# Patient Record
Sex: Male | Born: 1955 | Race: White | Hispanic: No | Marital: Married | Smoking: Never smoker
Health system: Southern US, Community
[De-identification: ages and names within clinical notes are randomized; demographics above are authoritative.]

## PROBLEM LIST (undated history)

## (undated) DIAGNOSIS — E785 Hyperlipidemia, unspecified: Secondary | ICD-10-CM

## (undated) DIAGNOSIS — F419 Anxiety disorder, unspecified: Secondary | ICD-10-CM

## (undated) DIAGNOSIS — R7301 Impaired fasting glucose: Secondary | ICD-10-CM

## (undated) DIAGNOSIS — G4700 Insomnia, unspecified: Secondary | ICD-10-CM

## (undated) DIAGNOSIS — I10 Essential (primary) hypertension: Secondary | ICD-10-CM

## (undated) DIAGNOSIS — L80 Vitiligo: Secondary | ICD-10-CM

## (undated) DIAGNOSIS — K219 Gastro-esophageal reflux disease without esophagitis: Secondary | ICD-10-CM

## (undated) DIAGNOSIS — Z86010 Personal history of colonic polyps: Secondary | ICD-10-CM

## (undated) HISTORY — DX: Impaired fasting glucose: R73.01

## (undated) HISTORY — DX: Personal history of colonic polyps: Z86.010

## (undated) HISTORY — DX: Hyperlipidemia, unspecified: E78.5

## (undated) HISTORY — DX: Anxiety disorder, unspecified: F41.9

## (undated) HISTORY — DX: Gastro-esophageal reflux disease without esophagitis: K21.9

## (undated) HISTORY — DX: Essential (primary) hypertension: I10

## (undated) HISTORY — DX: Vitiligo: L80

## (undated) HISTORY — PX: OTHER SURGICAL HISTORY: SHX169

## (undated) HISTORY — DX: Insomnia, unspecified: G47.00

## (undated) HISTORY — PX: WRIST SURGERY: SHX841

---

## 2001-02-06 ENCOUNTER — Encounter: Payer: Self-pay | Admitting: Otolaryngology

## 2001-02-06 ENCOUNTER — Ambulatory Visit (HOSPITAL_COMMUNITY): Admission: RE | Admit: 2001-02-06 | Discharge: 2001-02-06 | Payer: Self-pay | Admitting: Otolaryngology

## 2002-12-23 ENCOUNTER — Encounter: Payer: Self-pay | Admitting: Emergency Medicine

## 2002-12-23 ENCOUNTER — Emergency Department (HOSPITAL_COMMUNITY): Admission: EM | Admit: 2002-12-23 | Discharge: 2002-12-23 | Payer: Self-pay | Admitting: Emergency Medicine

## 2004-03-09 ENCOUNTER — Ambulatory Visit: Admission: RE | Admit: 2004-03-09 | Discharge: 2004-03-09 | Payer: Self-pay | Admitting: Family Medicine

## 2005-04-18 HISTORY — PX: COLONOSCOPY: SHX174

## 2006-03-20 ENCOUNTER — Ambulatory Visit: Payer: Self-pay | Admitting: Internal Medicine

## 2006-04-03 ENCOUNTER — Ambulatory Visit: Payer: Self-pay | Admitting: Internal Medicine

## 2010-08-21 ENCOUNTER — Emergency Department (HOSPITAL_COMMUNITY)
Admission: EM | Admit: 2010-08-21 | Discharge: 2010-08-21 | Disposition: A | Discharge status: Home / Self Care | Payer: BC Managed Care – PPO | Attending: Emergency Medicine | Admitting: Emergency Medicine

## 2010-08-21 DIAGNOSIS — F411 Generalized anxiety disorder: Secondary | ICD-10-CM | POA: Insufficient documentation

## 2010-08-21 DIAGNOSIS — M436 Torticollis: Secondary | ICD-10-CM | POA: Insufficient documentation

## 2011-02-28 ENCOUNTER — Other Ambulatory Visit: Payer: Self-pay | Admitting: Family Medicine

## 2011-02-28 DIAGNOSIS — R748 Abnormal levels of other serum enzymes: Secondary | ICD-10-CM

## 2011-03-02 ENCOUNTER — Ambulatory Visit (HOSPITAL_COMMUNITY)
Admission: RE | Admit: 2011-03-02 | Discharge: 2011-03-02 | Disposition: A | Discharge status: Home / Self Care | Payer: BC Managed Care – PPO | Source: Ambulatory Visit | Attending: Family Medicine | Admitting: Family Medicine

## 2011-03-02 DIAGNOSIS — K8010 Calculus of gallbladder with chronic cholecystitis without obstruction: Secondary | ICD-10-CM | POA: Insufficient documentation

## 2011-03-02 DIAGNOSIS — R748 Abnormal levels of other serum enzymes: Secondary | ICD-10-CM | POA: Insufficient documentation

## 2012-10-08 ENCOUNTER — Other Ambulatory Visit: Payer: Self-pay | Admitting: Family Medicine

## 2012-11-04 LAB — HM COLONOSCOPY

## 2012-12-20 ENCOUNTER — Other Ambulatory Visit: Payer: Self-pay | Admitting: Family Medicine

## 2012-12-20 NOTE — Telephone Encounter (Signed)
Refill times one, needs f/u OV

## 2013-01-11 ENCOUNTER — Telehealth: Payer: Self-pay | Admitting: Family Medicine

## 2013-01-11 DIAGNOSIS — Z125 Encounter for screening for malignant neoplasm of prostate: Secondary | ICD-10-CM

## 2013-01-11 DIAGNOSIS — Z79899 Other long term (current) drug therapy: Secondary | ICD-10-CM

## 2013-01-11 DIAGNOSIS — E782 Mixed hyperlipidemia: Secondary | ICD-10-CM

## 2013-01-11 NOTE — Telephone Encounter (Signed)
Patient needs BW paperwork °

## 2013-01-13 NOTE — Telephone Encounter (Signed)
Lip liv m7 psa 

## 2013-01-14 NOTE — Telephone Encounter (Signed)
Blood work orders placed in Epic. Patient notified. 

## 2013-01-18 LAB — LIPID PANEL
Cholesterol: 142 mg/dL (ref 0–200)
HDL: 36 mg/dL — ABNORMAL LOW (ref >39)
LDL Cholesterol: 80 mg/dL (ref 0–99)
Total CHOL/HDL Ratio: 3.9 Ratio
Triglycerides: 132 mg/dL (ref <150)
VLDL: 26 mg/dL (ref 0–40)

## 2013-01-18 LAB — BASIC METABOLIC PANEL
BUN: 17 mg/dL (ref 6–23)
Chloride: 104 mEq/L (ref 96–112)
Creat: 1.29 mg/dL (ref 0.50–1.35)
Glucose, Bld: 94 mg/dL (ref 70–99)
Potassium: 4.5 mEq/L (ref 3.5–5.3)
Sodium: 142 mEq/L (ref 135–145)

## 2013-01-18 LAB — HEPATIC FUNCTION PANEL
AST: 34 U/L (ref 0–37)
Alkaline Phosphatase: 89 U/L (ref 39–117)
Bilirubin, Direct: 0.2 mg/dL (ref 0.0–0.3)
Indirect Bilirubin: 0.8 mg/dL (ref 0.0–0.9)
Total Protein: 6.4 g/dL (ref 6.0–8.3)

## 2013-02-16 ENCOUNTER — Encounter: Payer: Self-pay | Admitting: *Deleted

## 2013-02-18 ENCOUNTER — Encounter: Payer: Self-pay | Admitting: Family Medicine

## 2013-02-18 ENCOUNTER — Ambulatory Visit (INDEPENDENT_AMBULATORY_CARE_PROVIDER_SITE_OTHER): Payer: BC Managed Care – PPO | Admitting: Family Medicine

## 2013-02-18 VITALS — BP 128/80 | Ht 70.0 in | Wt 207.2 lb

## 2013-02-18 DIAGNOSIS — Z8042 Family history of malignant neoplasm of prostate: Secondary | ICD-10-CM

## 2013-02-18 DIAGNOSIS — Z0000 Encounter for general adult medical examination without abnormal findings: Secondary | ICD-10-CM

## 2013-02-18 DIAGNOSIS — G4700 Insomnia, unspecified: Secondary | ICD-10-CM

## 2013-02-18 DIAGNOSIS — L80 Vitiligo: Secondary | ICD-10-CM | POA: Insufficient documentation

## 2013-02-18 NOTE — Progress Notes (Signed)
  Subjective:    Patient ID: Cristian Davis, male    DOB: May 29, 1955, 57 y.o.   MRN: [id number]  HPI Patient is here today for annual physical.  He has no problems/concerns.   Exercise not so hot, not working overtime  Has a recumbent bike  Already had flu vaccine.  Colonoscopy not due for a couple more years.  Results for orders placed in visit on 02/16/13  HM COLONOSCOPY      Result Value Range   HM Colonoscopy repeat in 10 years    HM COLONOSCOPY      Result Value Range   HM Colonoscopy repeat in 10 years        Review of Systems  Constitutional: Negative for fever, activity change and appetite change.  HENT: Negative for congestion and rhinorrhea.   Eyes: Negative for discharge.  Respiratory: Negative for cough and wheezing.   Cardiovascular: Negative for chest pain.  Gastrointestinal: Negative for vomiting, abdominal pain and blood in stool.  Genitourinary: Negative for frequency and difficulty urinating.  Musculoskeletal: Negative for neck pain.  Skin: Negative for rash.  Allergic/Immunologic: Negative for environmental allergies and food allergies.  Neurological: Negative for weakness and headaches.  Psychiatric/Behavioral: Negative for agitation.       Objective:   Physical Exam  Vitals reviewed. Constitutional: He appears well-developed and well-nourished.  HENT:  Head: Normocephalic and atraumatic.  Right Ear: External ear normal.  Left Ear: External ear normal.  Nose: Nose normal.  Mouth/Throat: Oropharynx is clear and moist.  Eyes: EOM are normal. Pupils are equal, round, and reactive to light.  Neck: Normal range of motion. Neck supple. No thyromegaly present.  Cardiovascular: Normal rate, regular rhythm and normal heart sounds.   No murmur heard. Pulmonary/Chest: Effort normal and breath sounds normal. No respiratory distress. He has no wheezes.  Abdominal: Soft. Bowel sounds are normal. He exhibits no distension and no mass. There is no  tenderness.  Genitourinary: Penis normal.  Musculoskeletal: Normal range of motion. He exhibits no edema.  Lymphadenopathy:    He has no cervical adenopathy.  Neurological: He is alert. He exhibits normal muscle tone.  Skin: Skin is warm and dry. No erythema.  Psychiatric: He has a normal mood and affect. His behavior is normal. Judgment normal.    Prostate within normal limits.  Skin does reveal multiple areas of vitiligo      Assessment & Plan:  Impression #1 wellness exam. #2 vitiligo. #3 insomnia. #4 family history of prostate cancer. Blood work all reviewed. Plan exercise strongly encourage. Maintain same medications. Check yearly. WSL

## 2013-03-03 IMAGING — US US ABDOMEN LIMITED
1 series · 14 of 25 positions shown · non-contrast
Comparison: None.

CLINICAL DATA: Elevated liver enzymes.

LIMITED ABDOMINAL ULTRASOUND - RIGHT UPPER QUADRANT

[Series 1: us abdomen limited · 0.31mm/px · 14 of 45 slices shown]
[im 1/45]
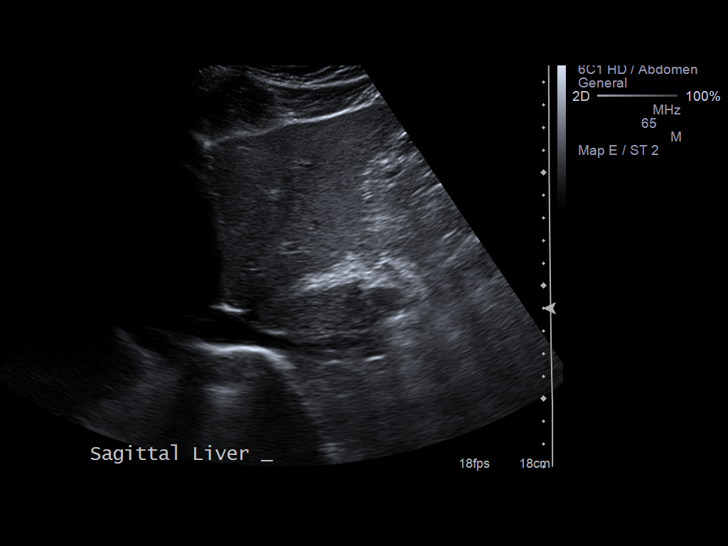
[im 4/45]
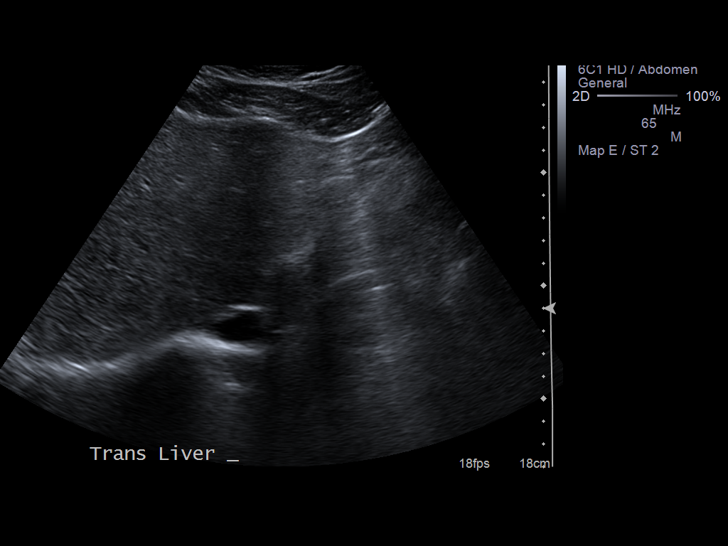
[im 8/45]
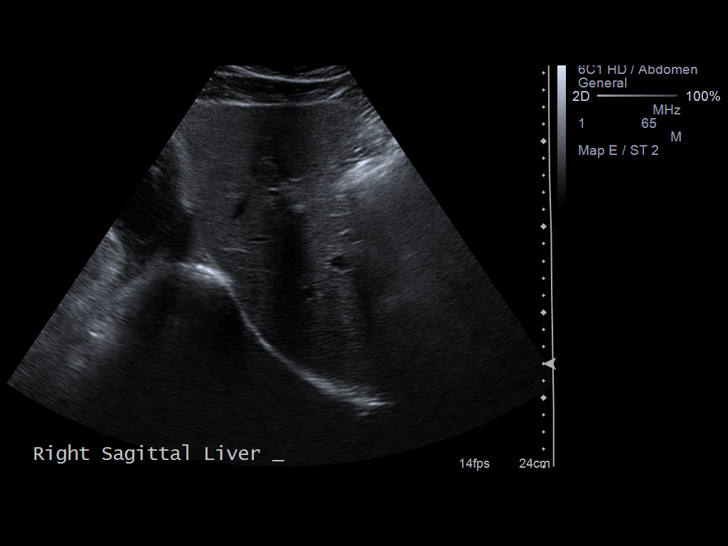
[im 12/45]
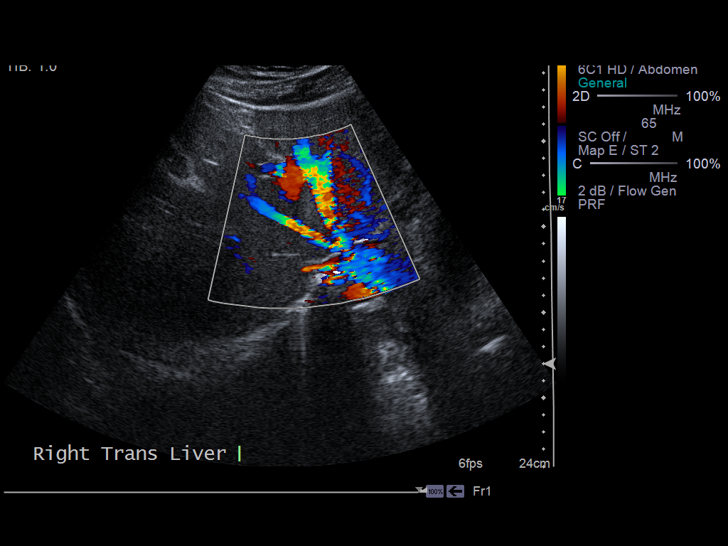
[im 15/45]
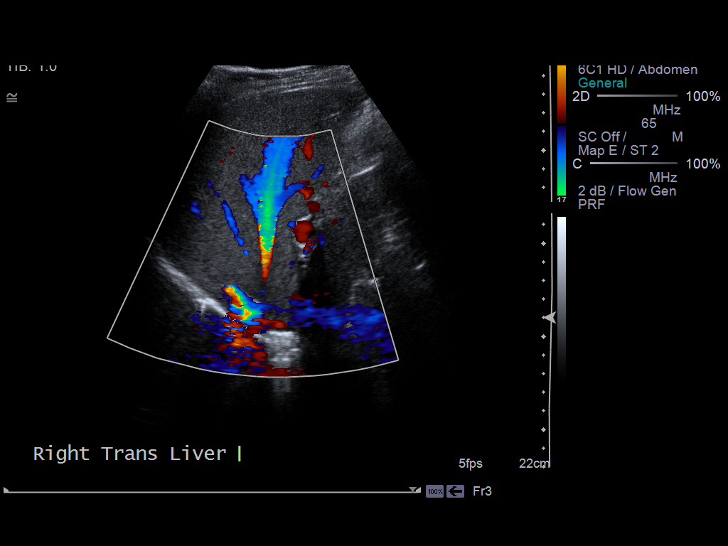
[im 17/45]
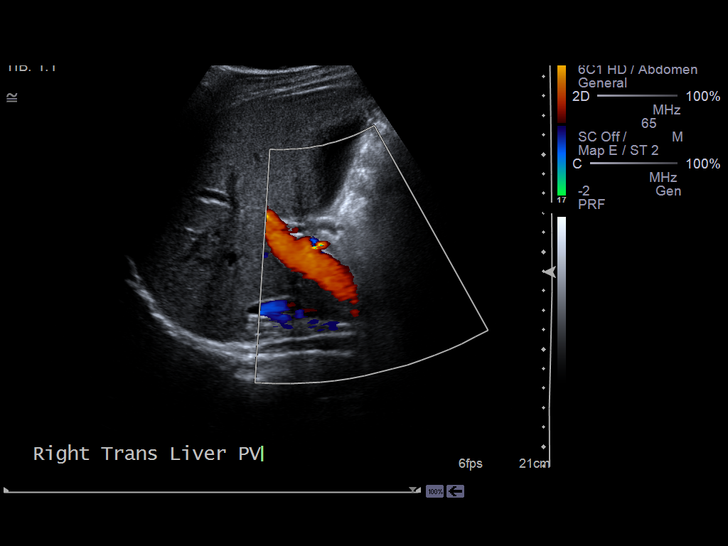
[im 21/45]
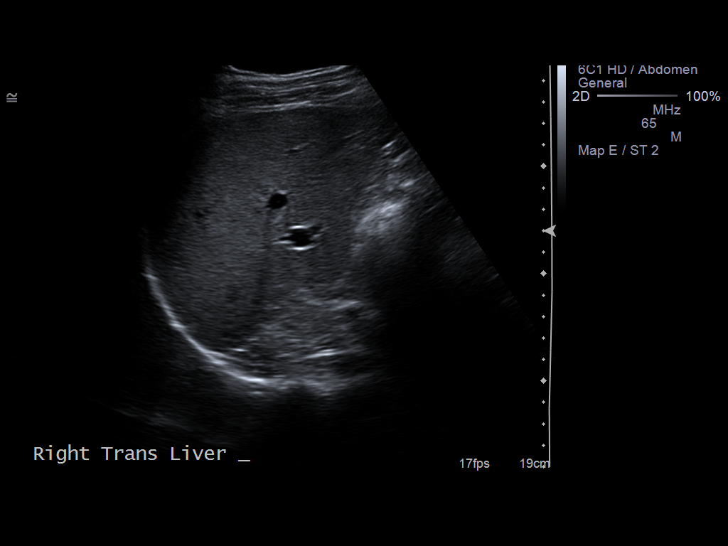
[im 24/45]
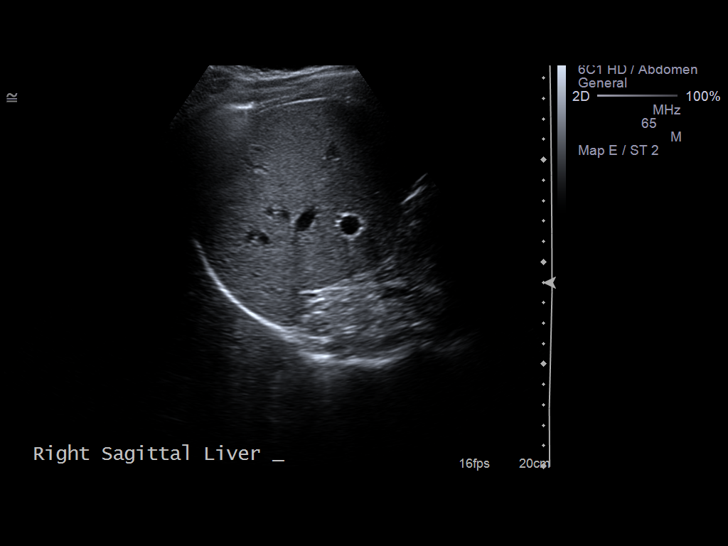
[im 28/45]
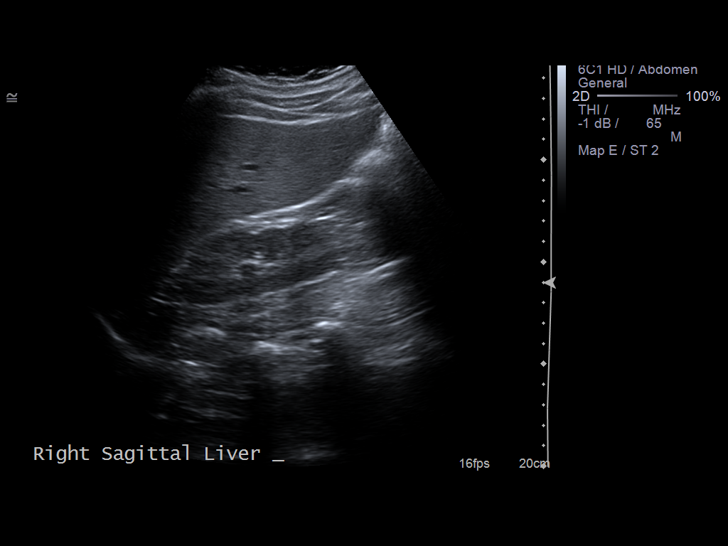
[im 30/45]
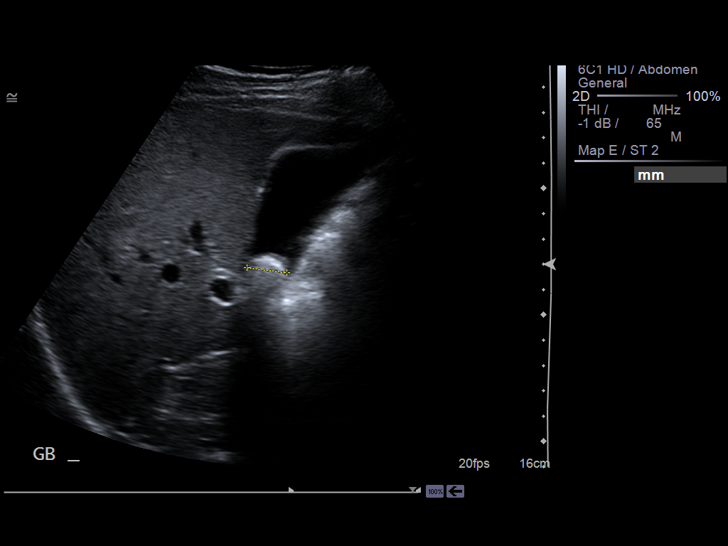
[im 34/45]
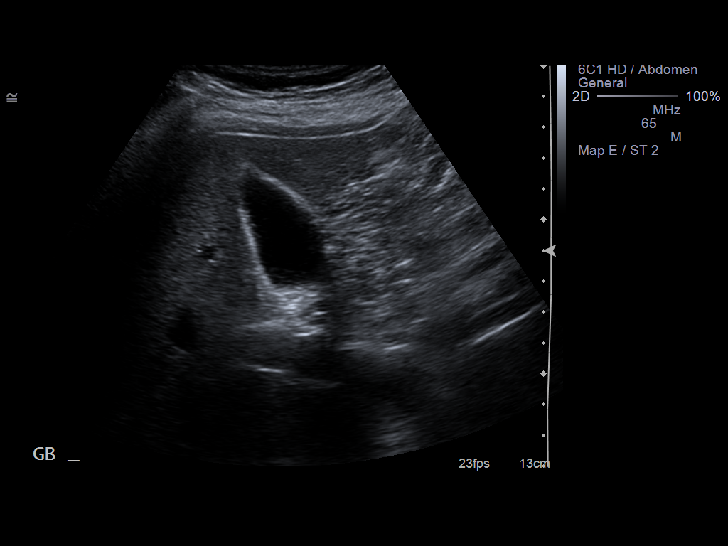
[im 37/45]
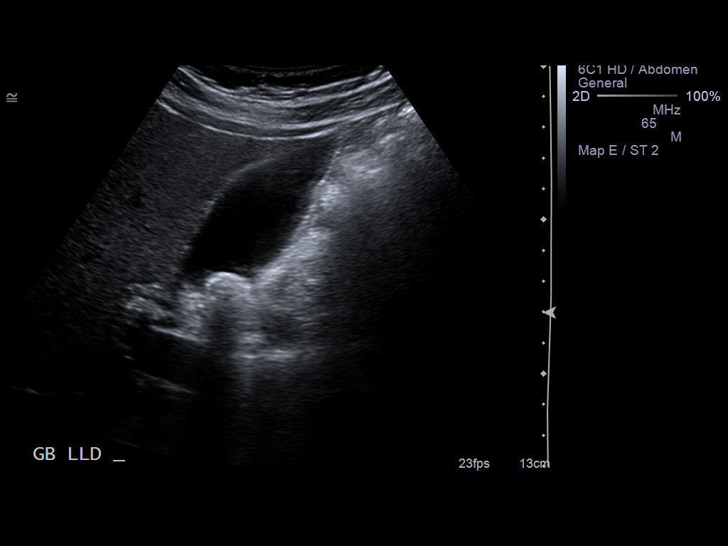
[im 41/45]
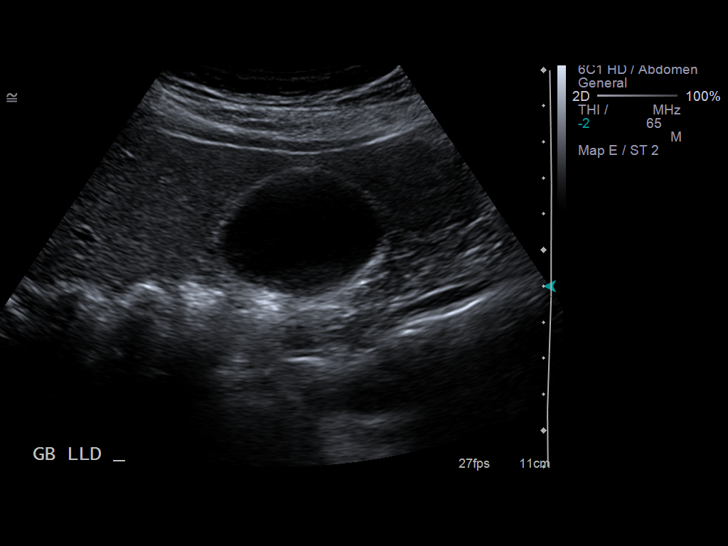
[im 45/45]
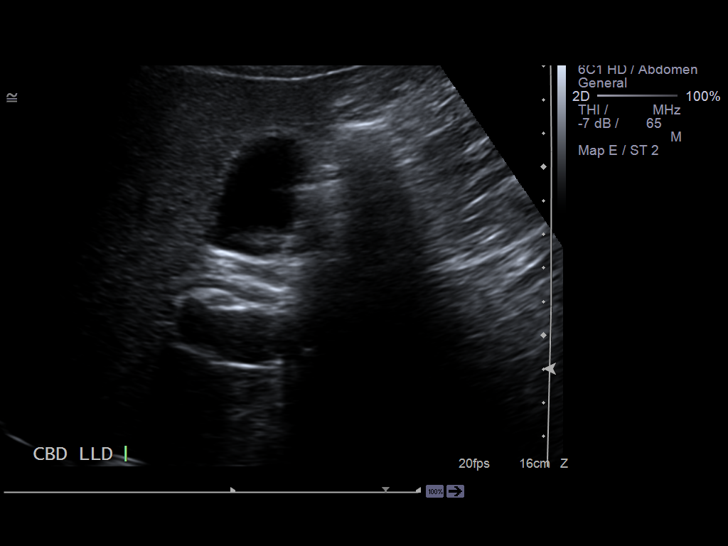

[14 of 25 positions shown; findings below may reference images not displayed]

FINDINGS: Gallbladder:  A 1.8 cm echogenic lesion near the gallbladder neck
shows posterior acoustic shadowing and moves slightly with left
lateral decubitus positioning.  No gallbladder wall thickening or
sonographic Murphy's sign.

Common bile duct:  Measures 4 mm, within normal limits.

Liver:  Uniform parenchymal echo texture.
IMPRESSION: Cholelithiasis without acute cholecystitis.

## 2013-03-15 ENCOUNTER — Other Ambulatory Visit: Payer: Self-pay | Admitting: Family Medicine

## 2013-08-24 ENCOUNTER — Other Ambulatory Visit: Payer: Self-pay | Admitting: Family Medicine

## 2013-11-17 ENCOUNTER — Other Ambulatory Visit: Payer: Self-pay | Admitting: Family Medicine

## 2013-12-17 ENCOUNTER — Other Ambulatory Visit: Payer: Self-pay | Admitting: Family Medicine

## 2014-02-04 ENCOUNTER — Telehealth: Payer: Self-pay | Admitting: Family Medicine

## 2014-02-04 DIAGNOSIS — Z125 Encounter for screening for malignant neoplasm of prostate: Secondary | ICD-10-CM

## 2014-02-04 DIAGNOSIS — Z79899 Other long term (current) drug therapy: Secondary | ICD-10-CM

## 2014-02-04 DIAGNOSIS — E785 Hyperlipidemia, unspecified: Secondary | ICD-10-CM

## 2014-02-04 NOTE — Telephone Encounter (Signed)
Last had Lipid, Liver, Met 7 and PSA 01/2013

## 2014-02-04 NOTE — Telephone Encounter (Signed)
Blood work ordered in Epic. Patient notified. 

## 2014-02-04 NOTE — Telephone Encounter (Signed)
Patient needs order for blood work for upcoming physical.

## 2014-02-04 NOTE — Telephone Encounter (Signed)
Rep same 

## 2014-02-07 LAB — BASIC METABOLIC PANEL
BUN: 20 mg/dL (ref 6–23)
CHLORIDE: 103 meq/L (ref 96–112)
CO2: 28 meq/L (ref 19–32)
CREATININE: 1.21 mg/dL (ref 0.50–1.35)
Calcium: 9.4 mg/dL (ref 8.4–10.5)
GLUCOSE: 95 mg/dL (ref 70–99)
POTASSIUM: 4.3 meq/L (ref 3.5–5.3)
Sodium: 142 mEq/L (ref 135–145)

## 2014-02-07 LAB — HEPATIC FUNCTION PANEL
ALK PHOS: 97 U/L (ref 39–117)
ALT: 32 U/L (ref 0–53)
AST: 31 U/L (ref 0–37)
Albumin: 4.5 g/dL (ref 3.5–5.2)
BILIRUBIN DIRECT: 0.2 mg/dL (ref 0.0–0.3)
BILIRUBIN INDIRECT: 0.6 mg/dL (ref 0.2–1.2)
TOTAL PROTEIN: 6.3 g/dL (ref 6.0–8.3)
Total Bilirubin: 0.8 mg/dL (ref 0.2–1.2)

## 2014-02-07 LAB — LIPID PANEL
Cholesterol: 141 mg/dL (ref 0–200)
HDL: 38 mg/dL — ABNORMAL LOW (ref >39)
LDL CALC: 81 mg/dL (ref 0–99)
Total CHOL/HDL Ratio: 3.7 Ratio
Triglycerides: 110 mg/dL (ref <150)
VLDL: 22 mg/dL (ref 0–40)

## 2014-02-08 LAB — PSA: PSA: 0.46 ng/mL (ref <=4.00)

## 2014-02-24 ENCOUNTER — Ambulatory Visit (INDEPENDENT_AMBULATORY_CARE_PROVIDER_SITE_OTHER): Payer: BC Managed Care – PPO | Admitting: Family Medicine

## 2014-02-24 ENCOUNTER — Encounter: Payer: Self-pay | Admitting: Family Medicine

## 2014-02-24 VITALS — BP 126/80 | Ht 70.0 in | Wt 207.2 lb

## 2014-02-24 DIAGNOSIS — Z0000 Encounter for general adult medical examination without abnormal findings: Secondary | ICD-10-CM

## 2014-02-24 NOTE — Progress Notes (Addendum)
Subjective:    Patient ID: Cristian Davis, male    DOB: 03-27-1956, 58 y.o.   MRN: [id number]  HPI  The patient comes in today for a wellness visit.  exerc ise staying active, partial credit for that.  Has recumbent   A review of their health history was completed.  A review of medications was also completed.  Any needed refills; no  Eating habits: good  Falls/  MVA accidents in past few months: none  Regular exercise: not much  Specialist pt sees on regular basis: no  Preventative health issues were discussed.   Additional concerns: none  Results for orders placed or performed in visit on 02/04/14  Lipid panel  Result Value Ref Range   Cholesterol 141 0 - 200 mg/dL   Triglycerides 110 <150 mg/dL   HDL 38 (L) >39 mg/dL   Total CHOL/HDL Ratio 3.7 Ratio   VLDL 22 0 - 40 mg/dL   LDL Cholesterol 81 0 - 99 mg/dL  Hepatic function panel  Result Value Ref Range   Total Bilirubin 0.8 0.2 - 1.2 mg/dL   Bilirubin, Direct 0.2 0.0 - 0.3 mg/dL   Indirect Bilirubin 0.6 0.2 - 1.2 mg/dL   Alkaline Phosphatase 97 39 - 117 U/L   AST 31 0 - 37 U/L   ALT 32 0 - 53 U/L   Total Protein 6.3 6.0 - 8.3 g/dL   Albumin 4.5 3.5 - 5.2 g/dL  Basic metabolic panel  Result Value Ref Range   Sodium 142 135 - 145 mEq/L   Potassium 4.3 3.5 - 5.3 mEq/L   Chloride 103 96 - 112 mEq/L   CO2 28 19 - 32 mEq/L   Glucose, Bld 95 70 - 99 mg/dL   BUN 20 6 - 23 mg/dL   Creat 1.21 0.50 - 1.35 mg/dL   Calcium 9.4 8.4 - 10.5 mg/dL  PSA  Result Value Ref Range   PSA 0.46 <=4.00 ng/mL   Flu shot given at work amitryp still helping and working out well  Vitiligo about the same  Has noticed incr gi  Some tend for bowels to stretch out  No pain of  r tend or  Cramping  Night no abd pain  Reflux heartburn minimal, rec cutting down caffeine,and cut down chocolate    Review of Systems  Constitutional: Negative for fever, activity change and appetite change.  HENT: Negative for congestion  and rhinorrhea.   Eyes: Negative for discharge.  Respiratory: Negative for cough and wheezing.   Cardiovascular: Negative for chest pain.  Gastrointestinal: Negative for vomiting, abdominal pain and blood in stool.  Genitourinary: Negative for frequency and difficulty urinating.  Musculoskeletal: Negative for neck pain.  Skin: Negative for rash.  Allergic/Immunologic: Negative for environmental allergies and food allergies.  Neurological: Negative for weakness and headaches.  Psychiatric/Behavioral: Negative for agitation.  All other systems reviewed and are negative.      Objective:   Physical Exam  Constitutional: He appears well-developed and well-nourished.  HENT:  Head: Normocephalic and atraumatic.  Right Ear: External ear normal.  Left Ear: External ear normal.  Nose: Nose normal.  Mouth/Throat: Oropharynx is clear and moist.  Eyes: EOM are normal. Pupils are equal, round, and reactive to light.  Neck: Normal range of motion. Neck supple. No thyromegaly present.  Cardiovascular: Normal rate, regular rhythm and normal heart sounds.   No murmur heard. Pulmonary/Chest: Effort normal and breath sounds normal. No respiratory distress. He has no wheezes.  Abdominal: Soft.  Bowel sounds are normal. He exhibits no distension and no mass. There is no tenderness.  Genitourinary: Prostate normal and penis normal.  Musculoskeletal: Normal range of motion. He exhibits no edema.  Lymphadenopathy:    He has no cervical adenopathy.  Neurological: He is alert. He exhibits normal muscle tone.  Skin: Skin is warm and dry. No erythema.  Psychiatric: He has a normal mood and affect. His behavior is normal. Judgment normal.  Vitals reviewed.  Skin multiple discrete patches of vitiligo.       Assessment & Plan:  Impression 1 wellness exam up-to-date on colonoscopy to and 3 more years.#2 vitiligo #3 family history prostate cancer number for migraine headaches stable plan maintain same  medication. Diet exercise discussed in encourage. Check yearly. Use sunscreen. WSL

## 2014-03-21 ENCOUNTER — Other Ambulatory Visit: Payer: Self-pay | Admitting: Family Medicine

## 2014-03-24 ENCOUNTER — Telehealth: Payer: Self-pay | Admitting: Family Medicine

## 2014-03-24 MED ORDER — AMITRIPTYLINE HCL 75 MG PO TABS: ORAL_TABLET | ORAL | Status: DC

## 2014-03-24 NOTE — Telephone Encounter (Signed)
Patient was refilled his amitriptyline (ELAVIL) 75 MG tablet with a one month supply.  He wanted to know if we can give this to him on a 90 day schedule again?  In the notes it says "needs OV", but he was just in on 02/24/14 for a wellness exam and the patient assumed that this would count for this medicine.

## 2014-03-24 NOTE — Telephone Encounter (Signed)
Med sent, pt notified.

## 2014-09-27 ENCOUNTER — Other Ambulatory Visit: Payer: Self-pay | Admitting: Family Medicine

## 2014-09-29 NOTE — Telephone Encounter (Signed)
Ok 6 mo worth 

## 2015-02-23 ENCOUNTER — Telehealth: Payer: Self-pay | Admitting: Family Medicine

## 2015-02-23 DIAGNOSIS — Z13220 Encounter for screening for lipoid disorders: Secondary | ICD-10-CM

## 2015-02-23 DIAGNOSIS — Z125 Encounter for screening for malignant neoplasm of prostate: Secondary | ICD-10-CM

## 2015-02-23 DIAGNOSIS — Z79899 Other long term (current) drug therapy: Secondary | ICD-10-CM

## 2015-02-23 NOTE — Telephone Encounter (Signed)
Pt is wanting lab orders to be sent over for his wellness visit. Last lab per epic were: lipid,hepatic,bmp,and psa on 02/07/14

## 2015-02-23 NOTE — Telephone Encounter (Signed)
Rep same as one yr ago

## 2015-02-24 NOTE — Telephone Encounter (Signed)
Notified patient bloodwork has been ordered.  

## 2015-03-07 LAB — LIPID PANEL
CHOL/HDL RATIO: 4.1 ratio (ref 0.0–5.0)
Cholesterol, Total: 156 mg/dL (ref 100–199)
HDL: 38 mg/dL — AB (ref >39)
LDL CALC: 91 mg/dL (ref 0–99)
TRIGLYCERIDES: 135 mg/dL (ref 0–149)
VLDL Cholesterol Cal: 27 mg/dL (ref 5–40)

## 2015-03-07 LAB — HEPATIC FUNCTION PANEL
ALT: 40 IU/L (ref 0–44)
AST: 42 IU/L — ABNORMAL HIGH (ref 0–40)
Albumin: 4.5 g/dL (ref 3.5–5.5)
Alkaline Phosphatase: 98 IU/L (ref 39–117)
Bilirubin Total: 0.9 mg/dL (ref 0.0–1.2)
Bilirubin, Direct: 0.22 mg/dL (ref 0.00–0.40)
Total Protein: 6.5 g/dL (ref 6.0–8.5)

## 2015-03-07 LAB — PSA: Prostate Specific Ag, Serum: 0.4 ng/mL (ref 0.0–4.0)

## 2015-03-07 LAB — BASIC METABOLIC PANEL
BUN / CREAT RATIO: 14 (ref 9–20)
BUN: 16 mg/dL (ref 6–24)
CALCIUM: 9.1 mg/dL (ref 8.7–10.2)
CHLORIDE: 101 mmol/L (ref 97–106)
CO2: 24 mmol/L (ref 18–29)
Creatinine, Ser: 1.17 mg/dL (ref 0.76–1.27)
GFR calc Af Amer: 79 mL/min/{1.73_m2} (ref >59)
GFR calc non Af Amer: 68 mL/min/{1.73_m2} (ref >59)
GLUCOSE: 96 mg/dL (ref 65–99)
Potassium: 4.4 mmol/L (ref 3.5–5.2)
Sodium: 141 mmol/L (ref 136–144)

## 2015-03-26 ENCOUNTER — Other Ambulatory Visit: Payer: Self-pay | Admitting: Family Medicine

## 2015-03-30 ENCOUNTER — Encounter: Payer: Self-pay | Admitting: Family Medicine

## 2015-03-30 ENCOUNTER — Ambulatory Visit (INDEPENDENT_AMBULATORY_CARE_PROVIDER_SITE_OTHER): Payer: BLUE CROSS/BLUE SHIELD | Admitting: Family Medicine

## 2015-03-30 VITALS — BP 128/86 | Ht 70.0 in | Wt 210.6 lb

## 2015-03-30 DIAGNOSIS — Z0000 Encounter for general adult medical examination without abnormal findings: Secondary | ICD-10-CM

## 2015-03-30 DIAGNOSIS — G4700 Insomnia, unspecified: Secondary | ICD-10-CM

## 2015-03-30 DIAGNOSIS — R413 Other amnesia: Secondary | ICD-10-CM

## 2015-03-30 DIAGNOSIS — Z23 Encounter for immunization: Secondary | ICD-10-CM

## 2015-03-30 NOTE — Progress Notes (Signed)
Subjective:    Patient ID: Cristian Davis, male    DOB: [birth year], 59 y.o.   MRN: [id number]  HPI The patient comes in today for a wellness visit.  Results for orders placed or performed in visit on 02/23/15  Lipid panel  Result Value Ref Range   Cholesterol, Total 156 100 - 199 mg/dL   Triglycerides 135 0 - 149 mg/dL   HDL 38 (L) >39 mg/dL   VLDL Cholesterol Cal 27 5 - 40 mg/dL   LDL Calculated 91 0 - 99 mg/dL   Chol/HDL Ratio 4.1 0.0 - 5.0 ratio units  Hepatic function panel  Result Value Ref Range   Total Protein 6.5 6.0 - 8.5 g/dL   Albumin 4.5 3.5 - 5.5 g/dL   Bilirubin Total 0.9 0.0 - 1.2 mg/dL   Bilirubin, Direct 0.22 0.00 - 0.40 mg/dL   Alkaline Phosphatase 98 39 - 117 IU/L   AST 42 (H) 0 - 40 IU/L   ALT 40 0 - 44 IU/L  Basic metabolic panel  Result Value Ref Range   Glucose 96 65 - 99 mg/dL   BUN 16 6 - 24 mg/dL   Creatinine, Ser 1.17 0.76 - 1.27 mg/dL   GFR calc non Af Amer 68 >59 mL/min/1.73   GFR calc Af Amer 79 >59 mL/min/1.73   BUN/Creatinine Ratio 14 9 - 20   Sodium 141 136 - 144 mmol/L   Potassium 4.4 3.5 - 5.2 mmol/L   Chloride 101 97 - 106 mmol/L   CO2 24 18 - 29 mmol/L   Calcium 9.1 8.7 - 10.2 mg/dL  PSA  Result Value Ref Range   Prostate Specific Ag, Serum 0.4 0.0 - 4.0 ng/mL    fifty colonoscopy  Pos fam hx f prost cancer  A review of their health history was completed.  A review of medications was also completed.  Any needed refills; yes  Eating habits: trying to  Falls/  MVA accidents in past few months: no  Regular exercise: 2-3 times a week   Specialist pt sees on regular basis: no  Preventative health issues were discussed.   Additional concerns: memory  Ex bike couple times per wk,  Legs are getting storonger, still notes sob, rides bike for cardiopulm purposes   Memory nto as good as used to, not doing well in this regard. Last yr or so, forgeting simple things, might forget a little, if not concerntrating  forgets. fam hx of dementia, all sisters. Stress levbel not that high at this time, getting good sleep , o major dangeroud, modly vonyrny.  States amitriptyline definitely helps for sleep. Also helps for migraine headaches. No obvious side effects. No morning drowsiness. Review of Systems  Constitutional: Negative for fever, activity change and appetite change.  HENT: Negative for congestion and rhinorrhea.   Eyes: Negative for discharge.  Respiratory: Negative for cough and wheezing.   Cardiovascular: Negative for chest pain.  Gastrointestinal: Negative for vomiting, abdominal pain and blood in stool.  Genitourinary: Negative for frequency and difficulty urinating.  Musculoskeletal: Negative for neck pain.  Skin: Negative for rash.  Allergic/Immunologic: Negative for environmental allergies and food allergies.  Neurological: Negative for weakness and headaches.  Psychiatric/Behavioral: Negative for agitation.  All other systems reviewed and are negative.      Objective:   Physical Exam  Constitutional: He appears well-developed and well-nourished.  HENT:  Head: Normocephalic and atraumatic.  Right Ear: External ear normal.  Left Ear: External ear normal.  Nose:  Nose normal.  Mouth/Throat: Oropharynx is clear and moist.  Eyes: EOM are normal. Pupils are equal, round, and reactive to light.  Neck: Normal range of motion. Neck supple. No thyromegaly present.  Cardiovascular: Normal rate, regular rhythm and normal heart sounds.   No murmur heard. Pulmonary/Chest: Effort normal and breath sounds normal. No respiratory distress. He has no wheezes.  Abdominal: Soft. Bowel sounds are normal. He exhibits no distension and no mass. There is no tenderness.  Genitourinary: Penis normal.  Musculoskeletal: Normal range of motion. He exhibits no edema.  Lymphadenopathy:    He has no cervical adenopathy.  Neurological: He is alert. He exhibits normal muscle tone.  Skin: Skin is warm and  dry. No erythema.  Psychiatric: He has a normal mood and affect. His behavior is normal. Judgment normal.  Vitals reviewed.         Assessment & Plan:  Impression 1 wellness exam #2 chronic headaches and insomnia control well with amitriptyline. Meds reviewed. Patient maintain same #3 short-term memory issues. Many questions asked patient completely oriented. Judgment intact. Just short-term memory loss not a sign of coming dementia and discussed at length plan flu shot today. Colonoscopy next year. Blood work reviewed. Amitriptyline reviewed diet exercise discussed WSL

## 2015-06-22 ENCOUNTER — Other Ambulatory Visit: Payer: Self-pay | Admitting: Family Medicine

## 2016-02-10 ENCOUNTER — Telehealth: Payer: Self-pay | Admitting: Family Medicine

## 2016-02-10 DIAGNOSIS — Z125 Encounter for screening for malignant neoplasm of prostate: Secondary | ICD-10-CM

## 2016-02-10 DIAGNOSIS — Z13220 Encounter for screening for lipoid disorders: Secondary | ICD-10-CM

## 2016-02-10 DIAGNOSIS — Z79899 Other long term (current) drug therapy: Secondary | ICD-10-CM

## 2016-02-10 NOTE — Telephone Encounter (Signed)
Patient requesting order for blood work for upcoming appointment in December.

## 2016-02-10 NOTE — Telephone Encounter (Signed)
Patient last labs 11/16- Lipid, Liver, Met 7 and PSA

## 2016-02-10 NOTE — Telephone Encounter (Signed)
Blood work ordered in EPIC. Patient notified. 

## 2016-02-10 NOTE — Telephone Encounter (Signed)
Rep same 

## 2016-02-28 LAB — BASIC METABOLIC PANEL
BUN/Creatinine Ratio: 12 (ref 9–20)
BUN: 15 mg/dL (ref 6–24)
CALCIUM: 9.5 mg/dL (ref 8.7–10.2)
CHLORIDE: 100 mmol/L (ref 96–106)
CO2: 26 mmol/L (ref 18–29)
Creatinine, Ser: 1.27 mg/dL (ref 0.76–1.27)
GFR calc Af Amer: 71 mL/min/{1.73_m2} (ref >59)
GFR calc non Af Amer: 61 mL/min/{1.73_m2} (ref >59)
GLUCOSE: 114 mg/dL — AB (ref 65–99)
Potassium: 4.4 mmol/L (ref 3.5–5.2)
Sodium: 145 mmol/L — ABNORMAL HIGH (ref 134–144)

## 2016-02-28 LAB — LIPID PANEL
CHOL/HDL RATIO: 4.1 ratio (ref 0.0–5.0)
Cholesterol, Total: 161 mg/dL (ref 100–199)
HDL: 39 mg/dL — AB (ref >39)
LDL CALC: 93 mg/dL (ref 0–99)
TRIGLYCERIDES: 146 mg/dL (ref 0–149)
VLDL Cholesterol Cal: 29 mg/dL (ref 5–40)

## 2016-02-28 LAB — HEPATIC FUNCTION PANEL
ALT: 35 IU/L (ref 0–44)
AST: 33 IU/L (ref 0–40)
Albumin: 4.6 g/dL (ref 3.5–5.5)
Alkaline Phosphatase: 106 IU/L (ref 39–117)
BILIRUBIN TOTAL: 1.1 mg/dL (ref 0.0–1.2)
Bilirubin, Direct: 0.24 mg/dL (ref 0.00–0.40)
TOTAL PROTEIN: 6.6 g/dL (ref 6.0–8.5)

## 2016-02-28 LAB — PSA: PROSTATE SPECIFIC AG, SERUM: 0.6 ng/mL (ref 0.0–4.0)

## 2016-03-29 ENCOUNTER — Encounter: Payer: Self-pay | Admitting: Internal Medicine

## 2016-04-04 ENCOUNTER — Ambulatory Visit (INDEPENDENT_AMBULATORY_CARE_PROVIDER_SITE_OTHER): Payer: BLUE CROSS/BLUE SHIELD | Admitting: Family Medicine

## 2016-04-04 ENCOUNTER — Encounter: Payer: Self-pay | Admitting: Family Medicine

## 2016-04-04 ENCOUNTER — Encounter: Payer: Self-pay | Admitting: Internal Medicine

## 2016-04-04 VITALS — BP 132/80 | Ht 69.5 in | Wt 213.0 lb

## 2016-04-04 DIAGNOSIS — G8929 Other chronic pain: Secondary | ICD-10-CM

## 2016-04-04 DIAGNOSIS — R070 Pain in throat: Secondary | ICD-10-CM

## 2016-04-04 DIAGNOSIS — F5101 Primary insomnia: Secondary | ICD-10-CM | POA: Diagnosis not present

## 2016-04-04 DIAGNOSIS — Z0000 Encounter for general adult medical examination without abnormal findings: Secondary | ICD-10-CM

## 2016-04-04 MED ORDER — AMITRIPTYLINE HCL 25 MG PO TABS: ORAL_TABLET | ORAL | 0 refills | Status: DC

## 2016-04-04 NOTE — Patient Instructions (Signed)
Try nexium ea morn for a month, if does not help try zyrtec 10 at night for a mont Prediabetes Prediabetes is the condition of having a blood sugar (blood glucose) level that is higher than it should be, but not high enough for you to be diagnosed with type 2 diabetes. Having prediabetes puts you at risk for developing type 2 diabetes (type 2 diabetes mellitus). Prediabetes may be called impaired glucose tolerance or impaired fasting glucose. Prediabetes usually does not cause symptoms. Your health care provider can diagnose this condition with blood tests. You may be tested for prediabetes if you are overweight and if you have at least one other risk factor for prediabetes. Risk factors for prediabetes include:  Having a family member with type 2 diabetes.  Being overweight or obese.  Being older than age 19.  Being of American-Indian, African-American, Hispanic/Latino, or Asian/Pacific Islander descent.  Having an inactive (sedentary) lifestyle.  Having a history of gestational diabetes or polycystic ovarian syndrome (PCOS).  Having low levels of good cholesterol (HDL-C) or high levels of blood fats (triglycerides).  Having high blood pressure. What is blood glucose and how is blood glucose measured?   Blood glucose refers to the amount of glucose in your bloodstream. Glucose comes from eating foods that contain sugars and starches (carbohydrates) that the body breaks down into glucose. Your blood glucose level may be measured in mg/dL (milligrams per deciliter) or mmol/L (millimoles per liter).Your blood glucose may be checked with one or more of the following blood tests:  A fasting blood glucose (FBG) test. You will not be allowed to eat (you will fast) for at least 8 hours before a blood sample is taken.  A normal range for FBG is 70-100 mg/dl (3.9-5.6 mmol/L).  An A1c (hemoglobin A1c) blood test. This test provides information about blood glucose control over the previous  2?105months.  An oral glucose tolerance test (OGTT). This test measures your blood glucose twice:  After fasting. This is your baseline level.  Two hours after you drink a beverage that contains glucose. You may be diagnosed with prediabetes:  If your FBG is 100?125 mg/dL (5.6-6.9 mmol/L).  If your A1c level is 5.7?6.4%.  If your OGGT result is 140?199 mg/dL (7.8-11 mmol/L). These blood tests may be repeated to confirm your diagnosis. What happens if blood glucose is too high? The pancreas produces a hormone (insulin) that helps move glucose from the bloodstream into cells. When cells in the body do not respond properly to insulin that the body makes (insulin resistance), excess glucose builds up in the blood instead of going into cells. As a result, high blood glucose (hyperglycemia) can develop, which can cause many complications. This is a symptom of prediabetes. What can happen if blood glucose stays higher than normal for a long time? Having high blood glucose for a long time is dangerous. Too much glucose in your blood can damage your nerves and blood vessels. Long-term damage can lead to complications from diabetes, which may include:  Heart disease.  Stroke.  Blindness.  Kidney disease.  Depression.  Poor circulation in the feet and legs, which could lead to surgical removal (amputation) in severe cases. How can prediabetes be prevented from turning into type 2 diabetes?   To help prevent type 2 diabetes, take the following actions:  Be physically active.  Do moderate-intensity physical activity for at least 30 minutes on at least 5 days of the week, or as much as told by your health care  provider. This could be brisk walking, biking, or water aerobics.  Ask your health care provider what activities are safe for you. A mix of physical activities may be best, such as walking, swimming, cycling, and strength training.  Lose weight as told by your health care  provider.  Losing 5-7% of your body weight can reverse insulin resistance.  Your health care provider can determine how much weight loss is best for you and can help you lose weight safely.  Follow a healthy meal plan. This includes eating lean proteins, complex carbohydrates, fresh fruits and vegetables, low-fat dairy products, and healthy fats.  Follow instructions from your health care provider about eating or drinking restrictions.  Make an appointment to see a diet and nutrition specialist (registered dietitian) to help you create a healthy eating plan that is right for you.  Do not smoke or use any tobacco products, such as cigarettes, chewing tobacco, and e-cigarettes. If you need help quitting, ask your health care provider.  Take over-the-counter and prescription medicines as told by your health care provider. You may be prescribed medicines that help lower the risk of type 2 diabetes. This information is not intended to replace advice given to you by your health care provider. Make sure you discuss any questions you have with your health care provider. Document Released: 07/27/2015 Document Revised: 09/10/2015 Document Reviewed: 05/26/2015 Elsevier Interactive Patient Education  2017 [employer].

## 2016-04-04 NOTE — Progress Notes (Signed)
Subjective:    Patient ID: Cristian Davis, male    DOB: 01/05/1956, 60 y.o.   MRN: [id number]  HPI The patient comes in today for a wellness visit.   Results for orders placed or performed in visit on 02/10/16  Lipid panel  Result Value Ref Range   Cholesterol, Total 161 100 - 199 mg/dL   Triglycerides 146 0 - 149 mg/dL   HDL 39 (L) >39 mg/dL   VLDL Cholesterol Cal 29 5 - 40 mg/dL   LDL Calculated 93 0 - 99 mg/dL   Chol/HDL Ratio 4.1 0.0 - 5.0 ratio units  Hepatic function panel  Result Value Ref Range   Total Protein 6.6 6.0 - 8.5 g/dL   Albumin 4.6 3.5 - 5.5 g/dL   Bilirubin Total 1.1 0.0 - 1.2 mg/dL   Bilirubin, Direct 0.24 0.00 - 0.40 mg/dL   Alkaline Phosphatase 106 39 - 117 IU/L   AST 33 0 - 40 IU/L   ALT 35 0 - 44 IU/L  Basic metabolic panel  Result Value Ref Range   Glucose 114 (H) 65 - 99 mg/dL   BUN 15 6 - 24 mg/dL   Creatinine, Ser 1.27 0.76 - 1.27 mg/dL   GFR calc non Af Amer 61 >59 mL/min/1.73   GFR calc Af Amer 71 >59 mL/min/1.73   BUN/Creatinine Ratio 12 9 - 20   Sodium 145 (H) 134 - 144 mmol/L   Potassium 4.4 3.5 - 5.2 mmol/L   Chloride 100 96 - 106 mmol/L   CO2 26 18 - 29 mmol/L   Calcium 9.5 8.7 - 10.2 mg/dL  PSA  Result Value Ref Range   Prostate Specific Ag, Serum 0.6 0.0 - 4.0 ng/mL   A review of their health history was completed.  A review of medications was also completed.  Neg colonoscopyhad flu shot alredy   Taking the amitryp faithfully, helped knock down ththe anity   Takes reg   Took the wrong ned accidentally  Few weks ago   Due for next colonoscop none in the past  Curious about weaning of the amitryp now     Any needed refills; none  Eating habits: health conscious  Falls/  MVA accidents in past few months: none  Regular exercise: none  Specialist pt sees on regular basis: none  Preventative health issues were discussed.   Additional concerns: none  Mo had adult onset diabetes  Diet drinks  occas choc  cookie  Not a big [place] person  Not exercising much these days  Patient notes chronic irritation in the throat. Even went to see an ENT doctor. They recommended a trial of Nexium. Patient does notice some postnasal drip like clearing of throat at times. Also notes some reflux type symptoms at times  Review of Systems  Constitutional: Negative for activity change, appetite change and fever.  HENT: Negative for congestion and rhinorrhea.   Eyes: Negative for discharge.  Respiratory: Negative for cough and wheezing.   Cardiovascular: Negative for chest pain.  Gastrointestinal: Negative for abdominal pain, blood in stool and vomiting.  Genitourinary: Negative for difficulty urinating and frequency.  Musculoskeletal: Negative for neck pain.  Skin: Negative for rash.  Allergic/Immunologic: Negative for environmental allergies and food allergies.  Neurological: Negative for weakness and headaches.  Psychiatric/Behavioral: Negative for agitation.  All other systems reviewed and are negative.      Objective:   Physical Exam  Constitutional: He appears well-developed and well-nourished.  HENT:  Head: Normocephalic and  atraumatic.  Right Ear: External ear normal.  Left Ear: External ear normal.  Nose: Nose normal.  Mouth/Throat: Oropharynx is clear and moist.  Eyes: EOM are normal. Pupils are equal, round, and reactive to light.  Neck: Normal range of motion. Neck supple. No thyromegaly present.  Cardiovascular: Normal rate, regular rhythm and normal heart sounds.   No murmur heard. Pulmonary/Chest: Effort normal and breath sounds normal. No respiratory distress. He has no wheezes.  Abdominal: Soft. Bowel sounds are normal. He exhibits no distension and no mass. There is no tenderness.  Genitourinary: Penis normal.  Musculoskeletal: Normal range of motion. He exhibits no edema.  Lymphadenopathy:    He has no cervical adenopathy.  Neurological: He is alert. He exhibits normal muscle  tone.  Skin: Skin is warm and dry. No erythema.  Psychiatric: He has a normal mood and affect. His behavior is normal. Judgment normal.  Vitals reviewed.         Assessment & Plan:  Impression 1 wellness exam #2 amitriptyline patient uses primarily now for insomnia but originally use for headaches. Long discussion held. Patient would like to try to get off this medication wean recommended patient will give a trial #3 chronic throat symptoms. Approach discussed. Try month Nexium. Than a month of Zyrtec. Rationale discussed plan blood work reviewed. Medications refilled diet exercise discussed up-to-date on colonoscopy given shingles vaccine

## 2016-05-23 ENCOUNTER — Encounter: Payer: Self-pay | Admitting: Internal Medicine

## 2016-06-06 ENCOUNTER — Encounter: Payer: Self-pay | Admitting: Internal Medicine

## 2016-06-16 ENCOUNTER — Encounter: Payer: Self-pay | Admitting: Internal Medicine

## 2016-06-21 ENCOUNTER — Encounter: Payer: Self-pay | Admitting: Family Medicine

## 2016-06-21 ENCOUNTER — Ambulatory Visit (INDEPENDENT_AMBULATORY_CARE_PROVIDER_SITE_OTHER): Payer: 59 | Admitting: Family Medicine

## 2016-06-21 VITALS — BP 140/86 | Ht 69.5 in | Wt 216.0 lb

## 2016-06-21 DIAGNOSIS — F5101 Primary insomnia: Secondary | ICD-10-CM

## 2016-06-21 MED ORDER — AMITRIPTYLINE HCL 75 MG PO TABS: 75.0000 mg | ORAL_TABLET | Freq: Every day | ORAL | 3 refills | Status: DC

## 2016-06-21 NOTE — Progress Notes (Signed)
   Subjective:    Patient ID: Cristian Davis, male    DOB: July 12, 1955, 61 y.o.   MRN: [id number]  HPI Patient arrives to discuss stopping amitriptyline. Patient states he tried to wean off with the plan from last visit but he got all out of sorts and has went back to original dosing.  Went down to the fifties, noted some h a withthem  Not leeping as well  Then knocked down to 25, incr level of ha   Then stopped, felt bad off the med  Stomach symt and head hurting  ythen they suggest Review of Systems    No headache, no major weight loss or weight gain, no chest pain no back pain abdominal pain no change in bowel habits complete ROS otherwise negative  Objective:   Physical Exam Alert vitals stable, NAD. Blood pressure good on repeat. HEENT normal. Lungs clear. Heart regular rate and rhythm.        Assessment & Plan:  Impression 1 chronic amitriptyline usage for panic attacks. Patient attempted wheezing. Developed headaches. Also developed worsening insomnia. Long discussion held. This time patient would prefer to maintain 75 daily at bedtime. If down the road he would like to wean we'll try again with 10 mg tablets on a very slow wean

## 2016-07-11 ENCOUNTER — Ambulatory Visit (AMBULATORY_SURGERY_CENTER): Payer: Self-pay

## 2016-07-11 VITALS — Ht 70.0 in | Wt 213.8 lb

## 2016-07-11 DIAGNOSIS — Z1211 Encounter for screening for malignant neoplasm of colon: Secondary | ICD-10-CM

## 2016-07-11 NOTE — Progress Notes (Signed)
Denies allergies to eggs or soy products. Denies complication of anesthesia or sedation. Denies use of weight loss medication. Denies use of O2.   Emmi instructions declined.  

## 2016-07-12 ENCOUNTER — Encounter: Payer: Self-pay | Admitting: Internal Medicine

## 2016-07-25 ENCOUNTER — Ambulatory Visit (AMBULATORY_SURGERY_CENTER): Payer: 59 | Admitting: Internal Medicine

## 2016-07-25 ENCOUNTER — Encounter: Payer: Self-pay | Admitting: Internal Medicine

## 2016-07-25 VITALS — BP 122/68 | HR 61 | Temp 97.1°F | Resp 11 | Ht 70.0 in | Wt 213.0 lb

## 2016-07-25 DIAGNOSIS — Z1211 Encounter for screening for malignant neoplasm of colon: Secondary | ICD-10-CM | POA: Diagnosis present

## 2016-07-25 DIAGNOSIS — Z86010 Personal history of colonic polyps: Secondary | ICD-10-CM | POA: Diagnosis not present

## 2016-07-25 DIAGNOSIS — D123 Benign neoplasm of transverse colon: Secondary | ICD-10-CM

## 2016-07-25 DIAGNOSIS — Z1212 Encounter for screening for malignant neoplasm of rectum: Secondary | ICD-10-CM | POA: Diagnosis not present

## 2016-07-25 HISTORY — PX: COLONOSCOPY W/ POLYPECTOMY: SHX1380

## 2016-07-25 MED ORDER — SODIUM CHLORIDE 0.9 % IV SOLN
500.0000 mL | INTRAVENOUS | Status: DC
Start: 2016-07-25 — End: 2017-03-31

## 2016-07-25 NOTE — Progress Notes (Signed)
Report given to PACU, vss 

## 2016-07-25 NOTE — Op Note (Signed)
[place] Patient Name: Cristian Davis Procedure Date: 07/25/2016 10:28 AM MRN: [id number] Endoscopist: Gatha Mayer , MD Age: 61 Referring MD:  Date of Birth: 01-04-1956 Gender: Male Account #: [id number] Procedure:                Colonoscopy Indications:              Screening for colorectal malignant neoplasm, Last                            colonoscopy: 2007 Medicines:                Propofol per Anesthesia, Monitored Anesthesia Care Procedure:                Pre-Anesthesia Assessment:                           - Prior to the procedure, a History and Physical                            was performed, and patient medications and                            allergies were reviewed. The patient's tolerance of                            previous anesthesia was also reviewed. The risks                            and benefits of the procedure and the sedation                            options and risks were discussed with the patient.                            All questions were answered, and informed consent                            was obtained. Prior Anticoagulants: The patient has                            taken no previous anticoagulant or antiplatelet                            agents. ASA Grade Assessment: I - A normal, healthy                            patient. After reviewing the risks and benefits,                            the patient was deemed in satisfactory condition to                            undergo the procedure.  After obtaining informed consent, the colonoscope                            was passed under direct vision. Throughout the                            procedure, the patient's blood pressure, pulse, and                            oxygen saturations were monitored continuously. The                            Colonoscope was introduced through the anus and                            advanced to the the cecum,  identified by                            appendiceal orifice and ileocecal valve. The                            colonoscopy was performed without difficulty. The                            patient tolerated the procedure well. The quality                            of the bowel preparation was excellent. The bowel                            preparation used was SUPREP. The ileocecal valve,                            appendiceal orifice, and rectum were photographed. Scope In: 10:40:49 AM Scope Out: 11:01:11 AM Scope Withdrawal Time: 0 hours 15 minutes 51 seconds  Total Procedure Duration: 0 hours 20 minutes 22 seconds  Findings:                 The perianal and digital rectal examinations were                            normal.                           Two sessile polyps were found in the transverse                            colon. The polyps were diminutive in size. These                            polyps were removed with a cold biopsy forceps.                            Resection and retrieval were complete. Verification  of patient identification for the specimen was                            done. Estimated blood loss was minimal.                           A diminutive polyp was found in the transverse                            colon. The polyp was sessile. The polyp was removed                            with a cold snare. Resection and retrieval were                            complete. Verification of patient identification                            for the specimen was done. Estimated blood loss was                            minimal.                           The exam was otherwise without abnormality on                            direct and retroflexion views. Complications:            No immediate complications. Estimated Blood Loss:     Estimated blood loss was minimal. Impression:               - Two diminutive polyps in the transverse colon,                             removed with a cold biopsy forceps. Resected and                            retrieved.                           - One diminutive polyp in the transverse colon,                            removed with a cold snare. Resected and retrieved.                           - The examination was otherwise normal on direct                            and retroflexion views. Recommendation:           - Patient has a contact number available for                            emergencies. The  signs and symptoms of potential                            delayed complications were discussed with the                            patient. Return to normal activities tomorrow.                            Written discharge instructions were provided to the                            patient.                           - Resume previous diet.                           - Continue present medications.                           - Repeat colonoscopy is recommended. The                            colonoscopy date will be determined after pathology                            results from today's exam become available for                            review. Gatha Mayer, MD 07/25/2016 11:06:21 AM This report has been signed electronically.

## 2016-07-25 NOTE — Progress Notes (Signed)
Called to room to assist during endoscopic procedure.  Patient ID and intended procedure confirmed with present staff. Received instructions for my participation in the procedure from the performing physician.  

## 2016-07-25 NOTE — Patient Instructions (Addendum)
   I found and removed 3 tiny polyps that look benign. I will let you know pathology results and when to have another routine colonoscopy by mail and/or My Chart.  I appreciate the opportunity to care for you. Gatha Mayer, MD, [facility]   Information on polyps given to you today  YOU HAD AN ENDOSCOPIC PROCEDURE TODAY AT [facility]:   Refer to the procedure report that was given to you for any specific questions about what was found during the examination.  If the procedure report does not answer your questions, please call your gastroenterologist to clarify.  If you requested that your care partner not be given the details of your procedure findings, then the procedure report has been included in a sealed envelope for you to review at your convenience later.  YOU SHOULD EXPECT: Some feelings of bloating in the abdomen. Passage of more gas than usual.  Walking can help get rid of the air that was put into your GI tract during the procedure and reduce the bloating. If you had a lower endoscopy (such as a colonoscopy or flexible sigmoidoscopy) you may notice spotting of blood in your stool or on the toilet paper. If you underwent a bowel prep for your procedure, you may not have a normal bowel movement for a few days.  Please Note:  You might notice some irritation and congestion in your nose or some drainage.  This is from the oxygen used during your procedure.  There is no need for concern and it should clear up in a day or so.  SYMPTOMS TO REPORT IMMEDIATELY:   Following lower endoscopy (colonoscopy or flexible sigmoidoscopy):  Excessive amounts of blood in the stool  Significant tenderness or worsening of abdominal pains  Swelling of the abdomen that is new, acute  Fever of 100F or higher    For urgent or emergent issues, a gastroenterologist can be reached at any hour by calling [phone].   DIET:  We do recommend a small meal at first, but then you may  proceed to your regular diet.  Drink plenty of fluids but you should avoid alcoholic beverages for 24 hours.  ACTIVITY:  You should plan to take it easy for the rest of today and you should NOT DRIVE or use heavy machinery until tomorrow (because of the sedation medicines used during the test).    FOLLOW UP: Our staff will call the number listed on your records the next business day following your procedure to check on you and address any questions or concerns that you may have regarding the information given to you following your procedure. If we do not reach you, we will leave a message.  However, if you are feeling well and you are not experiencing any problems, there is no need to return our call.  We will assume that you have returned to your regular daily activities without incident.  If any biopsies were taken you will be contacted by phone or by letter within the next 1-3 weeks.  Please call us at [phone] if you have not heard about the biopsies in 3 weeks.    SIGNATURES/CONFIDENTIALITY: You and/or your care partner have signed paperwork which will be entered into your electronic medical record.  These signatures attest to the fact that that the information above on your After Visit Summary has been reviewed and is understood.  Full responsibility of the confidentiality of this discharge information lies with you and/or your care-partner.

## 2016-07-25 NOTE — Progress Notes (Signed)
Pt's states no medical or surgical changes since previsit or office visit. 

## 2016-07-26 ENCOUNTER — Telehealth: Payer: Self-pay | Admitting: *Deleted

## 2016-07-26 NOTE — Telephone Encounter (Signed)
No answer left message will attempt to call back later this afternoon. Sm 

## 2016-07-26 NOTE — Telephone Encounter (Signed)
  Follow up Call-  Call back number 07/25/2016  Post procedure Call Back phone  # [phone]  Permission to leave phone message Yes  Some recent data might be hidden     Patient questions:  Do you have a fever, pain , or abdominal swelling? No. Pain Score  0 *  Have you tolerated food without any problems? Yes.    Have you been able to return to your normal activities? Yes.    Do you have any questions about your discharge instructions: Diet   No. Medications  No. Follow up visit  No.  Do you have questions or concerns about your Care? No.  Actions: * If pain score is 4 or above: No action needed, pain <4.

## 2016-08-03 ENCOUNTER — Encounter: Payer: Self-pay | Admitting: Internal Medicine

## 2016-08-03 DIAGNOSIS — Z860101 Personal history of adenomatous and serrated colon polyps: Secondary | ICD-10-CM | POA: Insufficient documentation

## 2016-08-03 DIAGNOSIS — Z86010 Personal history of colonic polyps: Secondary | ICD-10-CM

## 2016-08-03 HISTORY — DX: Personal history of colonic polyps: Z86.010

## 2016-08-03 HISTORY — DX: Personal history of adenomatous and serrated colon polyps: Z86.0101

## 2016-08-03 NOTE — Progress Notes (Signed)
1 diminutive adenoma Recall colon 2025 - 7 yrs My Chart letter

## 2017-02-02 DIAGNOSIS — M7711 Lateral epicondylitis, right elbow: Secondary | ICD-10-CM | POA: Diagnosis not present

## 2017-02-02 DIAGNOSIS — M25521 Pain in right elbow: Secondary | ICD-10-CM | POA: Diagnosis not present

## 2017-03-02 ENCOUNTER — Telehealth: Payer: Self-pay | Admitting: Family Medicine

## 2017-03-02 DIAGNOSIS — Z0000 Encounter for general adult medical examination without abnormal findings: Secondary | ICD-10-CM

## 2017-03-02 NOTE — Telephone Encounter (Signed)
Patient has physical appointment 12/18 and needing lab done.

## 2017-03-02 NOTE — Telephone Encounter (Signed)
Lip liv m7 psa 

## 2017-03-02 NOTE — Telephone Encounter (Signed)
Spoke with patient and informed her per Dr.Steve Luking- labs have been ordered. Patient verbalized understanding.

## 2017-03-03 ENCOUNTER — Encounter: Payer: Self-pay | Admitting: Physician Assistant

## 2017-03-03 ENCOUNTER — Ambulatory Visit: Payer: 59 | Admitting: Physician Assistant

## 2017-03-03 VITALS — BP 110/70 | HR 76 | Ht 69.5 in | Wt 212.0 lb

## 2017-03-03 DIAGNOSIS — K219 Gastro-esophageal reflux disease without esophagitis: Secondary | ICD-10-CM

## 2017-03-03 MED ORDER — PANTOPRAZOLE SODIUM 40 MG PO TBEC: DELAYED_RELEASE_TABLET | ORAL | 6 refills | Status: DC

## 2017-03-03 NOTE — Progress Notes (Addendum)
Subjective:    Patient ID: Cristian Davis, male    DOB: [birth year], 61 y.o.   MRN: [id number]  HPI Cristian Davis is a pleasant 62 year old white male, known to Dr. Carlean Purl who comes in today with complaints of fullness in his throat and a feeling like there is a film in his throat that he can't clear with frequent throat clearing. He is not having any regular heartburn but does have some occasional burning up high in his throat. He has symptoms throughout most of the day, denies any dysphagia or odynophagia. Not aware of any nocturnal symptoms. No nocturnal awakening with cough or sour brash. He has no complaints of abdominal discomfort. Symptoms have been present over the past year to year and a half. He has tried over-the-counter Nexium and OTC Prilosec without any benefit. Patient has not had prior EGD.  He did have colonoscopy in April 2018 with finding of one diminutive adenoma and is indicated for 7 year interval follow-up.  Review of Systems Pertinent positive and negative review of systems were noted in the above HPI section.  All other review of systems was otherwise negative.  Outpatient Encounter Medications as of 03/03/2017  Medication Sig  . amitriptyline (ELAVIL) 75 MG tablet Take 1 tablet (75 mg total) by mouth at bedtime.  . pantoprazole (PROTONIX) 40 MG tablet Take 1 tab by mouth twice daily.    No Known Allergies Patient Active Problem List   Diagnosis Date Noted  . Hx of adenomatous polyp of colon 08/03/2016  . Insomnia 02/18/2013  . Vitiligo 02/18/2013  . Family history of prostate cancer 02/18/2013   Social History   Socioeconomic History  . Marital status: Married                                              Tobacco Use  . Smoking status: Never Smoker  . Smokeless tobacco: Never Used  Substance and Sexual Activity  . Alcohol use: Yes    Comment: occasional  . Drug use: No   Cristian Davis's family history includes Cancer in his father; Heart attack  in his father and sister.      Objective:    Vitals:   03/03/17 1430  BP: 110/70  Pulse: 76    Physical Exam  well-developed older white male in no acute distress, pleasant blood pressure 110/70 pulse 76, height 5 foot 9, weight 212, BMI 30.8. HEENT; nontraumatic normocephalic EOMI PERRLA sclera anicteric, Cardiovascular; regular rate and rhythm with S1-S2 no murmur or gallop, Pulmonary; clear bilaterally, Abdomen ;soft, nontender nondistended bowel sounds are active no palpable mass or hepatosplenomegaly, Extremities; no clubbing cyanosis or edema skin warm and dry, Neuropsych ;mood and affect appropriate       Assessment & Plan:   #71 61 year old white male with 1-1/2 year symptoms of chronic sensation of fullness in the throat and frequent throat clearing, as well as a sensation of a film in his throat that he cannot clear. Symptoms are consistent with GERD/LPR. With chronic symptoms in older Caucasian male will also rule out Barrett's  #2 history of adenomatous colon polyps-up-to-date with colonoscopy last done in April 2018 due for follow-up in [place]; Discussed  antireflux regimen with the patient including elevation of the head of the bed and nothing by mouth for at least 3 hours prior to bedtime. He was also given  an antireflux diet. Start pantoprazole 40 mg by mouth twice daily before meals breakfast and before meals dinner 1 month, if symptoms improve he may decrease to 40 mg by mouth every morning and continue. Patient will be scheduled for upper endoscopy with Dr. Carlean Purl. Procedure was discussed in detail with patient including indications, risks, and benefits and he is agreeable to proceed.   Sherre Wooton Genia Harold PA-C 03/03/2017   Cc: [name], MD  Agree with Ms. Genia Harold assessment and plan. Gatha Mayer, MD, [facility]

## 2017-03-03 NOTE — Patient Instructions (Signed)
We have sent the following medications to your pharmacy for you to pick up at your convenience: Cristian Davis, [name]. 1. Pantoprazole sodium 40 mg- Take 1 twice daily for 1 month then go to once daily, every morning before breakfast.  You have been scheduled for an endoscopy. Please follow written instructions given to you at your visit today. If you use inhalers (even only as needed), please bring them with you on the day of your procedure. Your physician has requested that you go to www.startemmi.com and enter the access code given to you at your visit today. This web site gives a general overview about your procedure. However, you should still follow specific instructions given to you by our office regarding your preparation for the procedure.

## 2017-03-10 DIAGNOSIS — Z0000 Encounter for general adult medical examination without abnormal findings: Secondary | ICD-10-CM | POA: Diagnosis not present

## 2017-03-11 LAB — BASIC METABOLIC PANEL
BUN / CREAT RATIO: 12 (ref 10–24)
BUN: 15 mg/dL (ref 8–27)
CO2: 28 mmol/L (ref 20–29)
Calcium: 9.5 mg/dL (ref 8.6–10.2)
Chloride: 102 mmol/L (ref 96–106)
Creatinine, Ser: 1.26 mg/dL (ref 0.76–1.27)
GFR calc Af Amer: 71 mL/min/{1.73_m2} (ref >59)
GFR calc non Af Amer: 61 mL/min/{1.73_m2} (ref >59)
GLUCOSE: 108 mg/dL — AB (ref 65–99)
Potassium: 4.6 mmol/L (ref 3.5–5.2)
Sodium: 145 mmol/L — ABNORMAL HIGH (ref 134–144)

## 2017-03-11 LAB — LIPID PANEL
CHOLESTEROL TOTAL: 156 mg/dL (ref 100–199)
Chol/HDL Ratio: 4 ratio (ref 0.0–5.0)
HDL: 39 mg/dL — AB (ref >39)
LDL Calculated: 86 mg/dL (ref 0–99)
Triglycerides: 155 mg/dL — ABNORMAL HIGH (ref 0–149)
VLDL CHOLESTEROL CAL: 31 mg/dL (ref 5–40)

## 2017-03-11 LAB — HEPATIC FUNCTION PANEL
ALK PHOS: 108 IU/L (ref 39–117)
ALT: 37 IU/L (ref 0–44)
AST: 45 IU/L — AB (ref 0–40)
Albumin: 4.6 g/dL (ref 3.6–4.8)
Bilirubin Total: 1.1 mg/dL (ref 0.0–1.2)
Bilirubin, Direct: 0.26 mg/dL (ref 0.00–0.40)
Total Protein: 6.4 g/dL (ref 6.0–8.5)

## 2017-03-11 LAB — PSA: Prostate Specific Ag, Serum: 0.8 ng/mL (ref 0.0–4.0)

## 2017-03-17 ENCOUNTER — Encounter: Payer: Self-pay | Admitting: Internal Medicine

## 2017-03-31 ENCOUNTER — Other Ambulatory Visit: Payer: Self-pay

## 2017-03-31 ENCOUNTER — Ambulatory Visit (AMBULATORY_SURGERY_CENTER): Payer: 59 | Admitting: Internal Medicine

## 2017-03-31 ENCOUNTER — Encounter: Payer: Self-pay | Admitting: Internal Medicine

## 2017-03-31 VITALS — BP 117/71 | HR 66 | Temp 98.2°F | Resp 12 | Ht 69.5 in | Wt 212.0 lb

## 2017-03-31 DIAGNOSIS — K2100 Gastro-esophageal reflux disease with esophagitis, without bleeding: Secondary | ICD-10-CM

## 2017-03-31 DIAGNOSIS — K219 Gastro-esophageal reflux disease without esophagitis: Secondary | ICD-10-CM | POA: Diagnosis not present

## 2017-03-31 HISTORY — PX: ESOPHAGOGASTRODUODENOSCOPY: SHX1529

## 2017-03-31 MED ORDER — SODIUM CHLORIDE 0.9 % IV SOLN: 500.0000 mL | INTRAVENOUS | Status: DC

## 2017-03-31 NOTE — Patient Instructions (Addendum)
This exam looks normal.  It is too soon to say that reflux not causing your symptoms.  It can take 2-3 months of treatment with twice a day PPI medication like you are on to fix it if it is GERD.  I suggest you try the pantoprazole twice a day for 2 months - if not any better than very unlikely that reflux causing the problem.   If better than stay on the twice a day PPI until well and then for 3 months more and try to reduce back to once a day.  If not better at all then talks with Dr. Wolfgang Phoenix about seeing an ENT doctor.  I appreciate the opportunity to care for you. Gatha Mayer, MD, FACG    YOU HAD AN ENDOSCOPIC PROCEDURE TODAY AT [facility]:   Refer to the procedure report that was given to you for any specific questions about what was found during the examination.  If the procedure report does not answer your questions, please call your gastroenterologist to clarify.  If you requested that your care partner not be given the details of your procedure findings, then the procedure report has been included in a sealed envelope for you to review at your convenience later.  YOU SHOULD EXPECT: Some feelings of bloating in the abdomen. Passage of more gas than usual.  Walking can help get rid of the air that was put into your GI tract during the procedure and reduce the bloating. If you had a lower endoscopy (such as a colonoscopy or flexible sigmoidoscopy) you may notice spotting of blood in your stool or on the toilet paper. If you underwent a bowel prep for your procedure, you may not have a normal bowel movement for a few days.  Please Note:  You might notice some irritation and congestion in your nose or some drainage.  This is from the oxygen used during your procedure.  There is no need for concern and it should clear up in a day or so.  SYMPTOMS TO REPORT IMMEDIATELY:     Following upper endoscopy (EGD)  Vomiting of blood or coffee ground material  New  chest pain or pain under the shoulder blades  Painful or persistently difficult swallowing  New shortness of breath  Fever of 100F or higher  Black, tarry-looking stools  For urgent or emergent issues, a gastroenterologist can be reached at any hour by calling [phone].   DIET:  We do recommend a small meal at first, but then you may proceed to your regular diet.  Drink plenty of fluids but you should avoid alcoholic beverages for 24 hours.  ACTIVITY:  You should plan to take it easy for the rest of today and you should NOT DRIVE or use heavy machinery until tomorrow (because of the sedation medicines used during the test).    FOLLOW UP: Our staff will call the number listed on your records the next business day following your procedure to check on you and address any questions or concerns that you may have regarding the information given to you following your procedure. If we do not reach you, we will leave a message.  However, if you are feeling well and you are not experiencing any problems, there is no need to return our call.  We will assume that you have returned to your regular daily activities without incident.  If any biopsies were taken you will be contacted by phone or by letter within the  next 1-3 weeks.  Please call us at [phone] if you have not heard about the biopsies in 3 weeks.    SIGNATURES/CONFIDENTIALITY: You and/or your care partner have signed paperwork which will be entered into your electronic medical record.  These signatures attest to the fact that that the information above on your After Visit Summary has been reviewed and is understood.  Full responsibility of the confidentiality of this discharge information lies with you and/or your care-partner.

## 2017-03-31 NOTE — Progress Notes (Signed)
To PACU, VSS. Report to [email or web address] 

## 2017-03-31 NOTE — Progress Notes (Signed)
Pt. Reports no change in his medical or surgical history since his pre-visit 03/03/2017.

## 2017-03-31 NOTE — Op Note (Signed)
[place] Patient Name: Cristian Davis Procedure Date: 03/31/2017 10:08 AM MRN: [id number] Endoscopist: Gatha Mayer , MD Age: 61 Referring MD:  Date of Birth: March 07, 1956 Gender: Male Account #: [id number] Procedure:                Upper GI endoscopy Indications:              Esophageal reflux symptoms that persist despite                            appropriate therapy Medicines:                Propofol per Anesthesia, Monitored Anesthesia Care Procedure:                Pre-Anesthesia Assessment:                           - Prior to the procedure, a History and Physical                            was performed, and patient medications and                            allergies were reviewed. The patient's tolerance of                            previous anesthesia was also reviewed. The risks                            and benefits of the procedure and the sedation                            options and risks were discussed with the patient.                            All questions were answered, and informed consent                            was obtained. Prior Anticoagulants: The patient has                            taken no previous anticoagulant or antiplatelet                            agents. ASA Grade Assessment: II - A patient with                            mild systemic disease. After reviewing the risks                            and benefits, the patient was deemed in                            satisfactory condition to undergo the procedure.  After obtaining informed consent, the endoscope was                            passed under direct vision. Throughout the                            procedure, the patient's blood pressure, pulse, and                            oxygen saturations were monitored continuously. The                            Endoscope was introduced through the mouth, and                            advanced to  the second part of duodenum. The upper                            GI endoscopy was accomplished without difficulty.                            The patient tolerated the procedure well. Scope In: Scope Out: Findings:                 The esophagus was normal.                           The stomach was normal.                           The examined duodenum was normal. Complications:            No immediate complications. Estimated Blood Loss:     Estimated blood loss: none. Impression:               - Normal esophagus.                           - Normal stomach.                           - Normal examined duodenum.                           - No specimens collected. Recommendation:           - Patient has a contact number available for                            emergencies. The signs and symptoms of potential                            delayed complications were discussed with the                            patient. Return to normal activities tomorrow.  Written discharge instructions were provided to the                            patient.                           - Resume previous diet.                           - Continue present medications.                           - Could still be GERD/LPR causing throat clearing                            but don't know- if not any better after 2 months                            bid PPI then see ENT I think                           If improving would take bid PPI x 3 months after                            better and try to return to qd after that Gatha Mayer, MD 03/31/2017 10:32:30 AM This report has been signed electronically.

## 2017-04-03 ENCOUNTER — Telehealth: Payer: Self-pay | Admitting: *Deleted

## 2017-04-03 NOTE — Telephone Encounter (Signed)
  Follow up Call-  Call back number 03/31/2017 07/25/2016  Post procedure Call Back phone  # [phone]  Permission to leave phone message Yes Yes  Some recent data might be hidden     Patient questions:  Do you have a fever, pain , or abdominal swelling? No. Pain Score  0 *  Have you tolerated food without any problems? Yes.    Have you been able to return to your normal activities? Yes.    Do you have any questions about your discharge instructions: Diet   No. Medications  No. Follow up visit  No.  Do you have questions or concerns about your Care? No.  Actions: * If pain score is 4 or above: No action needed, pain <4.

## 2017-04-04 ENCOUNTER — Encounter: Payer: Self-pay | Admitting: Family Medicine

## 2017-04-04 ENCOUNTER — Ambulatory Visit (INDEPENDENT_AMBULATORY_CARE_PROVIDER_SITE_OTHER): Payer: 59 | Admitting: Family Medicine

## 2017-04-04 VITALS — BP 138/74 | Ht 70.0 in | Wt 214.0 lb

## 2017-04-04 DIAGNOSIS — Z0000 Encounter for general adult medical examination without abnormal findings: Secondary | ICD-10-CM

## 2017-04-04 MED ORDER — AMITRIPTYLINE HCL 75 MG PO TABS: 75.0000 mg | ORAL_TABLET | Freq: Every day | ORAL | 3 refills | Status: DC

## 2017-04-04 NOTE — Progress Notes (Signed)
Subjective:    Patient ID: Cristian Davis, male    DOB: 10/07/1955, 61 y.o.   MRN: [id number]  HPI The patient comes in today for a wellness visit.    A review of their health history was completed.  A review of medications was also completed.  Any needed refills; [facility]  Eating habits: health conscious  Falls/  MVA accidents in past few months: none  Regular exercise: none  Specialist pt sees on regular basis: none  Preventative health issues were discussed.   Additional concerns: none  Still taking amitryp faithfully   Reflux more challenging of late  Results for orders placed or performed in visit on 03/02/17  Lipid panel  Result Value Ref Range   Cholesterol, Total 156 100 - 199 mg/dL   Triglycerides 155 (H) 0 - 149 mg/dL   HDL 39 (L) >39 mg/dL   VLDL Cholesterol Cal 31 5 - 40 mg/dL   LDL Calculated 86 0 - 99 mg/dL   Chol/HDL Ratio 4.0 0.0 - 5.0 ratio  Hepatic function panel  Result Value Ref Range   Total Protein 6.4 6.0 - 8.5 g/dL   Albumin 4.6 3.6 - 4.8 g/dL   Bilirubin Total 1.1 0.0 - 1.2 mg/dL   Bilirubin, Direct 0.26 0.00 - 0.40 mg/dL   Alkaline Phosphatase 108 39 - 117 IU/L   AST 45 (H) 0 - 40 IU/L   ALT 37 0 - 44 IU/L  Basic metabolic panel  Result Value Ref Range   Glucose 108 (H) 65 - 99 mg/dL   BUN 15 8 - 27 mg/dL   Creatinine, Ser 1.26 0.76 - 1.27 mg/dL   GFR calc non Af Amer 61 >59 mL/min/1.73   GFR calc Af Amer 71 >59 mL/min/1.73   BUN/Creatinine Ratio 12 10 - 24   Sodium 145 (H) 134 - 144 mmol/L   Potassium 4.6 3.5 - 5.2 mmol/L   Chloride 102 96 - 106 mmol/L   CO2 28 20 - 29 mmol/L   Calcium 9.5 8.6 - 10.2 mg/dL  PSA  Result Value Ref Range   Prostate Specific Ag, Serum 0.8 0.0 - 4.0 ng/mL        Review of Systems  Constitutional: Negative for activity change, appetite change and fever.  HENT: Negative for congestion and rhinorrhea.   Eyes: Negative for discharge.  Respiratory: Negative for  cough and wheezing.   Cardiovascular: Negative for chest pain.  Gastrointestinal: Negative for abdominal pain, blood in stool and vomiting.  Genitourinary: Negative for difficulty urinating and frequency.  Musculoskeletal: Negative for neck pain.  Skin: Negative for rash.  Allergic/Immunologic: Negative for environmental allergies and food allergies.  Neurological: Negative for weakness and headaches.  Psychiatric/Behavioral: Negative for agitation.  All other systems reviewed and are negative.      Objective:   Physical Exam  Constitutional: He appears well-developed and well-nourished.  HENT:  Head: Normocephalic and atraumatic.  Right Ear: External ear normal.  Left Ear: External ear normal.  Nose: Nose normal.  Mouth/Throat: Oropharynx is clear and moist.  Eyes: EOM are normal. Pupils are equal, round, and reactive to light.  Neck: Normal range of motion. Neck supple. No thyromegaly present.  Cardiovascular: Normal rate, regular rhythm and normal heart sounds.  No murmur heard. Pulmonary/Chest: Effort normal and breath sounds normal. No respiratory distress. He has no wheezes.  Abdominal: Soft. Bowel sounds are normal. He exhibits no distension and no mass. There is no tenderness.  Genitourinary: Penis normal.  Musculoskeletal: Normal range of motion. He exhibits no edema.  Lymphadenopathy:    He has no cervical adenopathy.  Neurological: He is alert. He exhibits normal muscle tone.  Skin: Skin is warm and dry. No erythema.  Psychiatric: He has a normal mood and affect. His behavior is normal. Judgment normal.  Vitals reviewed.         Assessment & Plan:  1 impression well adult exam.  Patient has gained some weight.  Exercise discussed and encouraged.  Blood work reviewed.  Up-to-date on colonoscopy  2.  Chronic insomnia.  Amitriptyline helps with this.  3.  Reflux.  Recent negative upper endoscopy patient on Protonix high-dose  Diet exercise discussed  medications refilled follow-up

## 2017-09-12 DIAGNOSIS — Z23 Encounter for immunization: Secondary | ICD-10-CM | POA: Diagnosis not present

## 2017-10-05 ENCOUNTER — Other Ambulatory Visit: Payer: Self-pay | Admitting: *Deleted

## 2017-10-05 ENCOUNTER — Telehealth: Payer: Self-pay | Admitting: *Deleted

## 2017-10-05 MED ORDER — PANTOPRAZOLE SODIUM 40 MG PO TBEC: DELAYED_RELEASE_TABLET | ORAL | 3 refills | Status: DC

## 2017-10-05 NOTE — Telephone Encounter (Signed)
Received fax from [facility] for a 90 day supply of twice daily dosing, Pantoprazole Sodium 40 mg.  Sent through [employer] to [employer].

## 2017-12-15 DIAGNOSIS — Z23 Encounter for immunization: Secondary | ICD-10-CM | POA: Diagnosis not present

## 2018-03-22 DIAGNOSIS — B078 Other viral warts: Secondary | ICD-10-CM | POA: Diagnosis not present

## 2018-03-22 DIAGNOSIS — X32XXXA Exposure to sunlight, initial encounter: Secondary | ICD-10-CM | POA: Diagnosis not present

## 2018-03-22 DIAGNOSIS — L570 Actinic keratosis: Secondary | ICD-10-CM | POA: Diagnosis not present

## 2018-04-02 ENCOUNTER — Telehealth: Payer: Self-pay | Admitting: Family Medicine

## 2018-04-02 ENCOUNTER — Other Ambulatory Visit: Payer: Self-pay | Admitting: Family Medicine

## 2018-04-02 DIAGNOSIS — Z13220 Encounter for screening for lipoid disorders: Secondary | ICD-10-CM

## 2018-04-02 DIAGNOSIS — Z125 Encounter for screening for malignant neoplasm of prostate: Secondary | ICD-10-CM

## 2018-04-02 DIAGNOSIS — Z79899 Other long term (current) drug therapy: Secondary | ICD-10-CM

## 2018-04-02 DIAGNOSIS — Z0000 Encounter for general adult medical examination without abnormal findings: Secondary | ICD-10-CM

## 2018-04-02 NOTE — Telephone Encounter (Signed)
Pt has CPE scheduled for 04/20/18. He would like to have lab work done before appt.

## 2018-04-02 NOTE — Telephone Encounter (Signed)
Rep same 

## 2018-04-02 NOTE — Telephone Encounter (Signed)
Lab orders placed and pt is aware 

## 2018-04-02 NOTE — Telephone Encounter (Signed)
Last labs 03/10/17 Lipid, hepatic, BMET, PSA. Please advise. Thank you

## 2018-04-06 DIAGNOSIS — Z0000 Encounter for general adult medical examination without abnormal findings: Secondary | ICD-10-CM | POA: Diagnosis not present

## 2018-04-06 DIAGNOSIS — Z13220 Encounter for screening for lipoid disorders: Secondary | ICD-10-CM | POA: Diagnosis not present

## 2018-04-06 DIAGNOSIS — Z79899 Other long term (current) drug therapy: Secondary | ICD-10-CM | POA: Diagnosis not present

## 2018-04-06 DIAGNOSIS — Z125 Encounter for screening for malignant neoplasm of prostate: Secondary | ICD-10-CM | POA: Diagnosis not present

## 2018-04-07 LAB — LIPID PANEL
Chol/HDL Ratio: 3.8 ratio (ref 0.0–5.0)
Cholesterol, Total: 163 mg/dL (ref 100–199)
HDL: 43 mg/dL (ref >39)
LDL Calculated: 89 mg/dL (ref 0–99)
Triglycerides: 155 mg/dL — ABNORMAL HIGH (ref 0–149)
VLDL Cholesterol Cal: 31 mg/dL (ref 5–40)

## 2018-04-07 LAB — BASIC METABOLIC PANEL
BUN/Creatinine Ratio: 14 (ref 10–24)
BUN: 19 mg/dL (ref 8–27)
CO2: 27 mmol/L (ref 20–29)
Calcium: 9.4 mg/dL (ref 8.6–10.2)
Chloride: 104 mmol/L (ref 96–106)
Creatinine, Ser: 1.33 mg/dL — ABNORMAL HIGH (ref 0.76–1.27)
GFR calc Af Amer: 66 mL/min/{1.73_m2} (ref >59)
GFR calc non Af Amer: 57 mL/min/{1.73_m2} — ABNORMAL LOW (ref >59)
Glucose: 106 mg/dL — ABNORMAL HIGH (ref 65–99)
POTASSIUM: 4.4 mmol/L (ref 3.5–5.2)
Sodium: 145 mmol/L — ABNORMAL HIGH (ref 134–144)

## 2018-04-07 LAB — HEPATIC FUNCTION PANEL
ALT: 35 IU/L (ref 0–44)
AST: 34 IU/L (ref 0–40)
Albumin: 4.6 g/dL (ref 3.6–4.8)
Alkaline Phosphatase: 99 IU/L (ref 39–117)
Bilirubin Total: 1.2 mg/dL (ref 0.0–1.2)
Bilirubin, Direct: 0.28 mg/dL (ref 0.00–0.40)
Total Protein: 6.5 g/dL (ref 6.0–8.5)

## 2018-04-07 LAB — PSA: Prostate Specific Ag, Serum: 0.4 ng/mL (ref 0.0–4.0)

## 2018-04-20 ENCOUNTER — Ambulatory Visit (INDEPENDENT_AMBULATORY_CARE_PROVIDER_SITE_OTHER): Payer: 59 | Admitting: Family Medicine

## 2018-04-20 ENCOUNTER — Encounter: Payer: Self-pay | Admitting: Family Medicine

## 2018-04-20 VITALS — BP 130/86 | Ht 70.0 in | Wt 213.2 lb

## 2018-04-20 DIAGNOSIS — N181 Chronic kidney disease, stage 1: Secondary | ICD-10-CM

## 2018-04-20 DIAGNOSIS — Z0000 Encounter for general adult medical examination without abnormal findings: Secondary | ICD-10-CM | POA: Diagnosis not present

## 2018-04-20 DIAGNOSIS — K2100 Gastro-esophageal reflux disease with esophagitis, without bleeding: Secondary | ICD-10-CM

## 2018-04-20 DIAGNOSIS — Z23 Encounter for immunization: Secondary | ICD-10-CM

## 2018-04-20 DIAGNOSIS — F5101 Primary insomnia: Secondary | ICD-10-CM | POA: Diagnosis not present

## 2018-04-20 MED ORDER — AMITRIPTYLINE HCL 75 MG PO TABS: 75.0000 mg | ORAL_TABLET | Freq: Every day | ORAL | 3 refills | Status: DC

## 2018-04-20 MED ORDER — PANTOPRAZOLE SODIUM 40 MG PO TBEC: DELAYED_RELEASE_TABLET | ORAL | 3 refills | Status: DC

## 2018-04-20 NOTE — Progress Notes (Signed)
Subjective:    Patient ID: Cristian Davis, male    DOB: [birth year], 63 y.o.   MRN: [id number]  HPI  The patient comes in today for a wellness visit.    A review of their health history was completed.  A review of medications was also completed.  Any needed refills; yes  Eating habits: trying to eat healthy  Falls/  MVA accidents in past few months: none  Regular exercise: irregular exercise  Specialist pt sees on regular basis: no  Preventative health issues were discussed.   Additional concerns: none  Results for orders placed or performed in visit on 04/02/18  PSA  Result Value Ref Range   Prostate Specific Ag, Serum 0.4 0.0 - 4.0 ng/mL  Basic Metabolic Panel (BMET)  Result Value Ref Range   Glucose 106 (H) 65 - 99 mg/dL   BUN 19 8 - 27 mg/dL   Creatinine, Ser 1.33 (H) 0.76 - 1.27 mg/dL   GFR calc non Af Amer 57 (L) >59 mL/min/1.73   GFR calc Af Amer 66 >59 mL/min/1.73   BUN/Creatinine Ratio 14 10 - 24   Sodium 145 (H) 134 - 144 mmol/L   Potassium 4.4 3.5 - 5.2 mmol/L   Chloride 104 96 - 106 mmol/L   CO2 27 20 - 29 mmol/L   Calcium 9.4 8.6 - 10.2 mg/dL  Hepatic function panel  Result Value Ref Range   Total Protein 6.5 6.0 - 8.5 g/dL   Albumin 4.6 3.6 - 4.8 g/dL   Bilirubin Total 1.2 0.0 - 1.2 mg/dL   Bilirubin, Direct 0.28 0.00 - 0.40 mg/dL   Alkaline Phosphatase 99 39 - 117 IU/L   AST 34 0 - 40 IU/L   ALT 35 0 - 44 IU/L  Lipid Profile  Result Value Ref Range   Cholesterol, Total 163 100 - 199 mg/dL   Triglycerides 155 (H) 0 - 149 mg/dL   HDL 43 >39 mg/dL   VLDL Cholesterol Cal 31 5 - 40 mg/dL   LDL Calculated 89 0 - 99 mg/dL   Chol/HDL Ratio 3.8 0.0 - 5.0 ratio   Has done the bowflex was compliant , but then came off it recently  Patient now on Protonix 1 to 2/day.  Had a work-up which revealed element of esophagitis.  Overall tolerates medicine well  Compliant with chronic amitriptyline.  Helps sleep also helps with history of  headaches.  Review of Systems  Constitutional: Negative for activity change, appetite change and fever.  HENT: Negative for congestion and rhinorrhea.   Eyes: Negative for discharge.  Respiratory: Negative for cough and wheezing.   Cardiovascular: Negative for chest pain.  Gastrointestinal: Negative for abdominal pain, blood in stool and vomiting.  Genitourinary: Negative for difficulty urinating and frequency.  Musculoskeletal: Negative for neck pain.  Skin: Negative for rash.  Allergic/Immunologic: Negative for environmental allergies and food allergies.  Neurological: Negative for weakness and headaches.  Psychiatric/Behavioral: Negative for agitation.       Objective:   Physical Exam Constitutional:      Appearance: He is well-developed.  HENT:     Head: Normocephalic and atraumatic.     Right Ear: External ear normal.     Left Ear: External ear normal.     Nose: Nose normal.  Eyes:     Pupils: Pupils are equal, round, and reactive to light.  Neck:     Musculoskeletal: Normal range of motion and neck supple.     Thyroid: No thyromegaly.  Cardiovascular:     Rate and Rhythm: Normal rate and regular rhythm.     Heart sounds: Normal heart sounds. No murmur.  Pulmonary:     Effort: Pulmonary effort is normal. No respiratory distress.     Breath sounds: Normal breath sounds. No wheezing.  Abdominal:     General: Bowel sounds are normal. There is no distension.     Palpations: Abdomen is soft. There is no mass.     Tenderness: There is no abdominal tenderness.  Genitourinary:    Penis: Normal.      Comments: Prostate exam within normal limits Musculoskeletal: Normal range of motion.  Lymphadenopathy:     Cervical: No cervical adenopathy.  Skin:    General: Skin is warm and dry.     Findings: No erythema.  Neurological:     Mental Status: He is alert.     Motor: No abnormal muscle tone.  Psychiatric:        Behavior: Behavior normal.        Judgment: Judgment  normal.           Assessment & Plan:  #1 wellness exam.  Diet discussed.  Exercise discussed.  Vaccinations reviewed.  Spring 2018, had colon with tub adenoma, next due in five yrs   2.  Chronic insomnia/history of headaches.  Amitriptyline managed well will maintain  3.  Slight diminution in renal function.  Discussed.  Will watch yearly  4.  Reflux.  Patient is asked me to take over prescriptions will do.  Proper use discussed  Follow-up yearly

## 2018-04-20 NOTE — Patient Instructions (Signed)
Debrox drops otc  Consider dr Elgie Congo if persist

## 2018-05-11 DIAGNOSIS — L821 Other seborrheic keratosis: Secondary | ICD-10-CM | POA: Diagnosis not present

## 2018-05-11 DIAGNOSIS — L570 Actinic keratosis: Secondary | ICD-10-CM | POA: Diagnosis not present

## 2018-05-11 DIAGNOSIS — L80 Vitiligo: Secondary | ICD-10-CM | POA: Diagnosis not present

## 2018-10-30 DIAGNOSIS — J343 Hypertrophy of nasal turbinates: Secondary | ICD-10-CM | POA: Insufficient documentation

## 2018-10-30 DIAGNOSIS — J342 Deviated nasal septum: Secondary | ICD-10-CM | POA: Insufficient documentation

## 2019-03-29 ENCOUNTER — Telehealth: Payer: Self-pay | Admitting: Family Medicine

## 2019-03-29 DIAGNOSIS — Z13220 Encounter for screening for lipoid disorders: Secondary | ICD-10-CM

## 2019-03-29 DIAGNOSIS — Z0000 Encounter for general adult medical examination without abnormal findings: Secondary | ICD-10-CM

## 2019-03-29 DIAGNOSIS — Z79899 Other long term (current) drug therapy: Secondary | ICD-10-CM

## 2019-03-29 DIAGNOSIS — Z125 Encounter for screening for malignant neoplasm of prostate: Secondary | ICD-10-CM

## 2019-03-29 NOTE — Telephone Encounter (Signed)
Rep same 

## 2019-03-29 NOTE — Telephone Encounter (Signed)
Last labs completed on 04/06/18 lip, met 7, hepatic and psa. Please advise. Thank you  

## 2019-03-29 NOTE — Telephone Encounter (Signed)
patient is requesting labs for physical on 1/13.

## 2019-04-01 NOTE — Telephone Encounter (Signed)
Lab orders placed and pt is aware 

## 2019-04-11 LAB — BASIC METABOLIC PANEL
BUN/Creatinine Ratio: 17 (ref 10–24)
BUN: 20 mg/dL (ref 8–27)
CO2: 27 mmol/L (ref 20–29)
Calcium: 9.6 mg/dL (ref 8.6–10.2)
Chloride: 104 mmol/L (ref 96–106)
Creatinine, Ser: 1.18 mg/dL (ref 0.76–1.27)
GFR calc Af Amer: 75 mL/min/{1.73_m2} (ref >59)
GFR calc non Af Amer: 65 mL/min/{1.73_m2} (ref >59)
Glucose: 108 mg/dL — ABNORMAL HIGH (ref 65–99)
Potassium: 4.8 mmol/L (ref 3.5–5.2)
Sodium: 144 mmol/L (ref 134–144)

## 2019-04-11 LAB — HEPATIC FUNCTION PANEL
ALT: 30 IU/L (ref 0–44)
AST: 33 IU/L (ref 0–40)
Albumin: 4.5 g/dL (ref 3.8–4.8)
Alkaline Phosphatase: 122 IU/L — ABNORMAL HIGH (ref 39–117)
Bilirubin Total: 1 mg/dL (ref 0.0–1.2)
Bilirubin, Direct: 0.25 mg/dL (ref 0.00–0.40)
Total Protein: 6.5 g/dL (ref 6.0–8.5)

## 2019-04-11 LAB — LIPID PANEL
Chol/HDL Ratio: 3.9 ratio (ref 0.0–5.0)
Cholesterol, Total: 169 mg/dL (ref 100–199)
HDL: 43 mg/dL (ref >39)
LDL Chol Calc (NIH): 101 mg/dL — ABNORMAL HIGH (ref 0–99)
Triglycerides: 141 mg/dL (ref 0–149)
VLDL Cholesterol Cal: 25 mg/dL (ref 5–40)

## 2019-04-11 LAB — PSA: Prostate Specific Ag, Serum: 0.5 ng/mL (ref 0.0–4.0)

## 2019-04-15 ENCOUNTER — Other Ambulatory Visit: Payer: Self-pay | Admitting: Family Medicine

## 2019-04-17 DIAGNOSIS — H9190 Unspecified hearing loss, unspecified ear: Secondary | ICD-10-CM | POA: Insufficient documentation

## 2019-04-17 DIAGNOSIS — H9313 Tinnitus, bilateral: Secondary | ICD-10-CM | POA: Insufficient documentation

## 2019-05-01 ENCOUNTER — Other Ambulatory Visit: Payer: Self-pay

## 2019-05-01 ENCOUNTER — Ambulatory Visit (INDEPENDENT_AMBULATORY_CARE_PROVIDER_SITE_OTHER): Payer: 59 | Admitting: Family Medicine

## 2019-05-01 VITALS — BP 122/78 | Temp 98.6°F | Wt 208.8 lb

## 2019-05-01 DIAGNOSIS — Z0000 Encounter for general adult medical examination without abnormal findings: Secondary | ICD-10-CM

## 2019-05-01 MED ORDER — TRIAMCINOLONE ACETONIDE 0.1 % EX CREA: 1.0000 "application " | TOPICAL_CREAM | Freq: Two times a day (BID) | CUTANEOUS | 2 refills | Status: DC

## 2019-05-01 MED ORDER — AMITRIPTYLINE HCL 75 MG PO TABS: 75.0000 mg | ORAL_TABLET | Freq: Every day | ORAL | 3 refills | Status: DC

## 2019-05-01 MED ORDER — PANTOPRAZOLE SODIUM 40 MG PO TBEC: DELAYED_RELEASE_TABLET | ORAL | 3 refills | Status: DC

## 2019-05-01 NOTE — Progress Notes (Signed)
Subjective:    Patient ID: Cristian Davis, male    DOB: [birth year], 64 y.o.   MRN: [id number]  HPI   The patient comes in today for a wellness visit.    A review of their health history was completed.  A review of medications was also completed.  Any needed refills; yes  Eating habits: could be better but trying to do good  Falls/  MVA accidents in past few months: none  Regular exercise: not much  Specialist pt sees on regular basis: none  Preventative health issues were discussed.   Additional concerns: none  Reflux  Has intermitent clearing of throat    Hx of upper endoscopy     amitryptiline takes faithfully     amiyptilin estill bnefits forntit     Pt has little tinitus in the es, hearing test slight dip in the 4 k     Review of Systems  Constitutional: Negative for activity change, appetite change and fever.  HENT: Negative for congestion and rhinorrhea.   Eyes: Negative for discharge.  Respiratory: Negative for cough and wheezing.   Cardiovascular: Negative for chest pain.  Gastrointestinal: Negative for abdominal pain, blood in stool and vomiting.  Genitourinary: Negative for difficulty urinating and frequency.  Musculoskeletal: Negative for neck pain.  Skin: Negative for rash.  Allergic/Immunologic: Negative for environmental allergies and food allergies.  Neurological: Negative for weakness and headaches.  Psychiatric/Behavioral: Negative for agitation.       Objective:   Physical Exam Constitutional:      Appearance: He is well-developed.  HENT:     Head: Normocephalic and atraumatic.     Right Ear: External ear normal.     Left Ear: External ear normal.     Nose: Nose normal.  Eyes:     Pupils: Pupils are equal, round, and reactive to light.  Neck:     Thyroid: No thyromegaly.  Cardiovascular:     Rate and Rhythm: Normal rate and regular rhythm.     Heart sounds: Normal heart sounds. No murmur.  Pulmonary:     Effort:  Pulmonary effort is normal. No respiratory distress.     Breath sounds: Normal breath sounds. No wheezing.  Abdominal:     General: Bowel sounds are normal. There is no distension.     Palpations: Abdomen is soft. There is no mass.     Tenderness: There is no abdominal tenderness.  Genitourinary:    Penis: Normal.   Musculoskeletal:        General: Normal range of motion.     Cervical back: Normal range of motion and neck supple.  Lymphadenopathy:     Cervical: No cervical adenopathy.  Skin:    General: Skin is warm and dry.     Findings: No erythema.  Neurological:     Mental Status: He is alert.     Motor: No abnormal muscle tone.  Psychiatric:        Behavior: Behavior normal.        Judgment: Judgment normal.    Prostate exam within normal limits.  Vitiligo evident on exam       Assessment & Plan:  Impression 1 wellness exam.  Diet discussed.  Exercise discussed.  Up-to-date on vaccines.  General concerns discussed.  Next colon due in 2023  2.  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  Triamcinolone twice daily to affected area.  3.  Vitiligo ongoing sun protection discussed  4.  Chronic headaches uses amitriptyline  nightly helps avoid patient to maintain  Follow-up yearly

## 2019-05-23 ENCOUNTER — Encounter: Payer: Self-pay | Admitting: Family Medicine

## 2019-08-05 ENCOUNTER — Ambulatory Visit: Payer: 59 | Admitting: Nurse Practitioner

## 2019-08-15 ENCOUNTER — Encounter: Payer: Self-pay | Admitting: Internal Medicine

## 2019-08-15 ENCOUNTER — Ambulatory Visit (INDEPENDENT_AMBULATORY_CARE_PROVIDER_SITE_OTHER): Payer: 59 | Admitting: Internal Medicine

## 2019-08-15 ENCOUNTER — Other Ambulatory Visit: Payer: Self-pay

## 2019-08-15 VITALS — BP 122/64 | HR 80 | Temp 98.5°F | Ht 69.0 in | Wt 208.2 lb

## 2019-08-15 DIAGNOSIS — R0989 Other specified symptoms and signs involving the circulatory and respiratory systems: Secondary | ICD-10-CM | POA: Insufficient documentation

## 2019-08-15 DIAGNOSIS — R6889 Other general symptoms and signs: Secondary | ICD-10-CM

## 2019-08-15 DIAGNOSIS — R198 Other specified symptoms and signs involving the digestive system and abdomen: Secondary | ICD-10-CM | POA: Insufficient documentation

## 2019-08-15 NOTE — Patient Instructions (Signed)
Taper your Pantoprazole as we discussed.   Make an appointment with Dr Claiborne Rigg to discuss your chronic throat clearing.   I appreciate the opportunity to care for you. Silvano Rusk, MD, [facility]

## 2019-08-15 NOTE — Progress Notes (Signed)
Cristian Davis 64 y.o. 1955/08/12 [id number]  Assessment & Plan:   Encounter Diagnoses  Name Primary?  . Chronic throat clearing Yes  . Globus sensation ?    The patient has had the symptoms for years despite taking pantoprazole actually twice a day.  I do not think this is GERD based upon that history.  Previous EGD in 2018 unrevealing including look at the larynx.  He has been to Dr. Wilburn Davis about deviated septum and tinnitus but I do not see that he has had a discussion about this and he says not.  I have asked him to return to Dr. Wilburn Davis to see if there is laryngeal or pharyngeal pathology or process causing this.  In the meantime I think he should stop taking his pantoprazole as it has not helped.  We could certainly perform diagnostic testing to see if he actually does have reflux but I would not do that at this time that could include Bravo pH testing and or 24-hour pH impedance testing which I would do off PPI to see if he has any reflux at all.  I did discuss with him how this might be a habitual thing i.e. behavioral.  I will be available as needed.  He does not seem to be anxious though we did not explore that in detail he has a history of panic attacks for which amitriptyline has helped.  Sometimes anxiety can cause globus but he has more of a throat clearing and a question of a globus sensation in my mind and not that classic persistent lump in throat sensation.  I appreciate the opportunity to care for this patient.  CC: Cristian Kirschner, MD Cristian Belfast, MD  Subjective:   Chief Complaint: Chronic throat clearing  HPI The patient presents with years of a sensation of something in his throat that needs to be cleared and after he clears several times the sensation is improved.  He does not really produce anything.  There is no significant postnasal drainage and he does not have any classic heartburn and he is on twice daily 40 mg pantoprazole without benefit.   Symptoms go back at least until 2018 at which point he had an EGD which was unrevealing.  He has seen Dr. Wilburn Davis of ear nose and throat with a very successful deviated septum repair and recently for tinnitus.  He has not brought up the symptoms with him.  No evaluation for them that I can see in chart review of ENT notes from 2019 and 2020.  No unintentional weight loss.  No allergy symptoms.  No breathing difficulty    .No Known Allergies Current Meds  Medication Sig  . amitriptyline (ELAVIL) 75 MG tablet Take 1 tablet (75 mg total) by mouth at bedtime.  . pantoprazole (PROTONIX) 40 MG tablet Take 1 tab by mouth twice daily.   Past Medical History:  Diagnosis Date  . Anxiety   . Hx of adenomatous polyp of colon 08/03/2016  . Impaired fasting glucose   . Insomnia   . Vitiligo    Past Surgical History:  Procedure Laterality Date  . COLONOSCOPY  2007   repeated 2018  . ESOPHAGOGASTRODUODENOSCOPY  03/31/2017  . lasic eye surgery Bilateral   . 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   Review of Systems As per HPI  Objective:   Physical Exam BP 122/64 (BP Location: Left Arm, Patient Position: Sitting, Cuff Size: Normal)   Pulse 80   Temp 98.5 F (36.9 C)   Ht 5\' 9"  (1.753 m) Comment: height measured without shoes  Wt 208 lb 4 oz (94.5 kg)   BMI 30.75 kg/m

## 2019-08-28 ENCOUNTER — Encounter: Payer: Self-pay | Admitting: Family Medicine

## 2019-08-28 ENCOUNTER — Other Ambulatory Visit: Payer: Self-pay

## 2019-08-28 ENCOUNTER — Ambulatory Visit: Payer: 59 | Admitting: Family Medicine

## 2019-08-28 VITALS — BP 138/80 | HR 76 | Temp 97.7°F | Ht 69.0 in | Wt 208.0 lb

## 2019-08-28 DIAGNOSIS — H1131 Conjunctival hemorrhage, right eye: Secondary | ICD-10-CM | POA: Diagnosis not present

## 2019-08-28 DIAGNOSIS — R030 Elevated blood-pressure reading, without diagnosis of hypertension: Secondary | ICD-10-CM

## 2019-08-28 NOTE — Patient Instructions (Addendum)
If seeing 150/90 or above call the office. Goal is around 120/80 or under.  If high numbers with headache, vision changes, dizziness, weakness, or limb weakness, go the ER.     Subconjunctival Hemorrhage Subconjunctival hemorrhage is bleeding that happens between the white part of your eye (sclera) and the clear membrane that covers the outside of your eye (conjunctiva). There are many tiny blood vessels near the surface of your eye. A subconjunctival hemorrhage happens when one or more of these vessels breaks and bleeds, causing a red patch to appear on your eye. This is similar to a bruise. Depending on the amount of bleeding, the red patch may only cover a small area of your eye or it may cover the entire visible part of the sclera. If a lot of blood collects under the conjunctiva, there may also be swelling. Subconjunctival hemorrhages do not affect your vision or cause pain, but your eye may feel irritated if there is swelling. Subconjunctival hemorrhages usually do not require treatment, and they usually disappear on their own within two weeks. What are the causes? This condition may be caused by:  Mild trauma, such as rubbing your eye too hard.  Blunt injuries, such as from playing sports or having contact with a deployed airbag.  Coughing, sneezing, or vomiting.  Straining, such as when lifting a heavy object.  High blood pressure.  Recent eye surgery.  Diabetes.  Certain medicines, especially blood thinners (anticoagulants).  Other conditions, such as eye tumors, bleeding disorders, or blood vessel abnormalities. Subconjunctival hemorrhages can also happen without an obvious cause. What are the signs or symptoms? Symptoms of this condition include:  A bright red or dark red patch on the white part of the eye. The red area may: ? Spread out to cover a larger area of the eye before it goes away. ? Turn brownish-yellow before it goes away.  Swelling around the  eye.  Mild eye irritation. How is this diagnosed? This condition is diagnosed with a physical exam. If your subconjunctival hemorrhage was caused by trauma, your health care provider may refer you to an eye specialist (ophthalmologist) or another specialist to check for other injuries. You may have other tests, including:  An eye exam.  A blood pressure check.  Blood tests to check for bleeding disorders. If your subconjunctival hemorrhage was caused by trauma, X-rays or a CT scan may be done to check for other injuries. How is this treated? Usually, treatment is not needed for this condition. If you have discomfort, your health care provider may recommend eye drops or cold compresses. Follow these instructions at home:  Take over-the-counter and prescription medicines only as directed by your health care provider.  Use eye drops or cold compresses to help with discomfort as directed by your health care provider.  Avoid activities, things, and environments that may irritate or injure your eye.  Keep all follow-up visits as told by your health care provider. This is important. Contact a health care provider if:  You have pain in your eye.  The bleeding does not go away within 3 weeks.  You keep getting new subconjunctival hemorrhages. Get help right away if:  Your vision changes or you have difficulty seeing.  You suddenly develop severe sensitivity to light.  You develop a severe headache, persistent vomiting, confusion, or abnormal tiredness (lethargy).  Your eye seems to bulge or protrude from your eye socket.  You develop unexplained bruises on your body.  You have unexplained bleeding in  another area of your body. Summary  Subconjunctival hemorrhage is bleeding that happens between the white part of your eye and the clear membrane that covers the outside of your eye.  This condition is similar to a bruise.  Subconjunctival hemorrhages usually do not require  treatment, and they usually disappear on their own within two weeks.  Use eye drops or cold compresses to help with discomfort as directed by your health care provider. This information is not intended to replace advice given to you by your health care provider. Make sure you discuss any questions you have with your health care provider. Document Revised: 09/19/2018 Document Reviewed: 01/03/2018 Elsevier Patient Education  [place].

## 2019-08-28 NOTE — Progress Notes (Signed)
Patient ID: Cristian Davis, male    DOB: 1956-03-22, 64 y.o.   MRN: [id number]   Chief Complaint  Patient presents with  . Elevated blood pressure   Subjective:    HPI  pt noticed a busted blood vessel in right eye yesterday and checked his bp and it was elevated. He cannot remember the reading. 189/?  Pt having blood on rt medial eye, no vision changes, HA, dizziness, cp, sob, or leg swelling.  No limb weakness or speech changes.  Last night took bp a couple of times at home and was seeing [id number] systolic and is a newer cuff.   This AM checked BP and was [id number] systolic.  Doesn't recall heavy lifting, straining, trauma to eye.  Denies -Coughing or sneezing fits. Denies high salt items recently.  Medical History Cristian Davis has a past medical history of Anxiety, adenomatous polyp of colon (08/03/2016), Impaired fasting glucose, Insomnia, and Vitiligo.   Outpatient Encounter Medications as of 08/28/2019  Medication Sig  . amitriptyline (ELAVIL) 75 MG tablet Take 1 tablet (75 mg total) by mouth at bedtime.   No facility-administered encounter medications on file as of 08/28/2019.     Review of Systems  Constitutional: Negative for chills and fever.  Eyes: Positive for redness.  Respiratory: Negative for cough, chest tightness and shortness of breath.   Cardiovascular: Negative for chest pain, palpitations and leg swelling.  Skin: Negative for rash.  Neurological: Negative for dizziness, facial asymmetry, weakness, light-headedness, numbness and headaches.     Vitals BP 138/80   Pulse 76   Temp 97.7 F (36.5 C)   Ht 5\' 9"  (1.753 m)   Wt 208 lb (94.3 kg)   SpO2 96%   BMI 30.72 kg/m   Objective:   Physical Exam Constitutional:      General: He is not in acute distress.    Appearance: Normal appearance. He is not ill-appearing.  HENT:     Head: Normocephalic.     Nose: Nose normal. No congestion or rhinorrhea.     Mouth/Throat:     Mouth: Mucous membranes are  moist.  Eyes:     Extraocular Movements: Extraocular movements intact.     Pupils: Pupils are equal, round, and reactive to light.     Comments: + rt medial eye with subconjunctival hemorrage.  Cardiovascular:     Rate and Rhythm: Normal rate and regular rhythm.     Pulses: Normal pulses.     Heart sounds: Normal heart sounds. No murmur.  Pulmonary:     Effort: Pulmonary effort is normal.     Breath sounds: Normal breath sounds. No wheezing, rhonchi or rales.  Musculoskeletal:        General: Normal range of motion.     Right lower leg: No edema.     Left lower leg: No edema.  Skin:    General: Skin is warm and dry.     Findings: No rash.  Neurological:     General: No focal deficit present.     Mental Status: He is alert and oriented to person, place, and time.     Cranial Nerves: No cranial nerve deficit.     Sensory: No sensory deficit.     Motor: No weakness.     Gait: Gait normal.  Psychiatric:        Mood and Affect: Mood normal.        Behavior: Behavior normal.      Assessment and Plan   1.  Elevated blood pressure reading in office without diagnosis of hypertension  2. Subconjunctival hemorrhage of right eye    Reviewed last few bp in office were in normal ranges.  -advising to watch salt intake and check bp 2x per day and call with numbers if over 150/90s or having any associated symptoms.  May need to bring cuff to check it in office.  Subconjunctival hemorrhage- self limited.  No vision changes or pain. Cont to monitor and rto if worsening.  Pt in agreement.  F/u prn. 08/28/2019

## 2020-04-23 ENCOUNTER — Telehealth: Payer: Self-pay

## 2020-04-23 DIAGNOSIS — Z79899 Other long term (current) drug therapy: Secondary | ICD-10-CM

## 2020-04-23 DIAGNOSIS — Z13220 Encounter for screening for lipoid disorders: Secondary | ICD-10-CM

## 2020-04-23 DIAGNOSIS — Z125 Encounter for screening for malignant neoplasm of prostate: Secondary | ICD-10-CM

## 2020-04-23 NOTE — Telephone Encounter (Signed)
Pt called and scheduled Phy in Feb needs blood work ordered   Pt call back [phone]

## 2020-04-23 NOTE — Telephone Encounter (Signed)
Pls order cbc, cmp, lipids, psa.   Thx. D.r Daijha Leggio  

## 2020-04-23 NOTE — Telephone Encounter (Signed)
Orders put in and pt was notified.  

## 2020-04-23 NOTE — Telephone Encounter (Signed)
Last labs 04/10/19 psa, liver, lipid, bmp

## 2020-05-16 LAB — CBC WITH DIFFERENTIAL/PLATELET
Basophils Absolute: 0 10*3/uL (ref 0.0–0.2)
Basos: 1 %
EOS (ABSOLUTE): 0.1 10*3/uL (ref 0.0–0.4)
Eos: 3 %
Hematocrit: 44.3 % (ref 37.5–51.0)
Hemoglobin: 16.1 g/dL (ref 13.0–17.7)
Immature Grans (Abs): 0 10*3/uL (ref 0.0–0.1)
Immature Granulocytes: 0 %
Lymphocytes Absolute: 1.2 10*3/uL (ref 0.7–3.1)
Lymphs: 25 %
MCH: 32.7 pg (ref 26.6–33.0)
MCHC: 36.3 g/dL — ABNORMAL HIGH (ref 31.5–35.7)
MCV: 90 fL (ref 79–97)
Monocytes Absolute: 0.5 10*3/uL (ref 0.1–0.9)
Monocytes: 10 %
Neutrophils Absolute: 2.8 10*3/uL (ref 1.4–7.0)
Neutrophils: 61 %
Platelets: 156 10*3/uL (ref 150–450)
RBC: 4.92 x10E6/uL (ref 4.14–5.80)
RDW: 11.9 % (ref 11.6–15.4)
WBC: 4.7 10*3/uL (ref 3.4–10.8)

## 2020-05-16 LAB — COMPREHENSIVE METABOLIC PANEL
ALT: 31 IU/L (ref 0–44)
AST: 35 IU/L (ref 0–40)
Albumin/Globulin Ratio: 2.7 — ABNORMAL HIGH (ref 1.2–2.2)
Albumin: 4.6 g/dL (ref 3.8–4.8)
Alkaline Phosphatase: 117 IU/L (ref 44–121)
BUN/Creatinine Ratio: 15 (ref 10–24)
BUN: 19 mg/dL (ref 8–27)
Bilirubin Total: 0.7 mg/dL (ref 0.0–1.2)
CO2: 25 mmol/L (ref 20–29)
Calcium: 9.4 mg/dL (ref 8.6–10.2)
Chloride: 104 mmol/L (ref 96–106)
Creatinine, Ser: 1.28 mg/dL — ABNORMAL HIGH (ref 0.76–1.27)
GFR calc Af Amer: 68 mL/min/{1.73_m2} (ref >59)
GFR calc non Af Amer: 59 mL/min/{1.73_m2} — ABNORMAL LOW (ref >59)
Globulin, Total: 1.7 g/dL (ref 1.5–4.5)
Glucose: 106 mg/dL — ABNORMAL HIGH (ref 65–99)
Potassium: 4.4 mmol/L (ref 3.5–5.2)
Sodium: 143 mmol/L (ref 134–144)
Total Protein: 6.3 g/dL (ref 6.0–8.5)

## 2020-05-16 LAB — LIPID PANEL
Chol/HDL Ratio: 3.4 ratio (ref 0.0–5.0)
Cholesterol, Total: 158 mg/dL (ref 100–199)
HDL: 46 mg/dL
LDL Chol Calc (NIH): 95 mg/dL (ref 0–99)
Triglycerides: 94 mg/dL (ref 0–149)
VLDL Cholesterol Cal: 17 mg/dL (ref 5–40)

## 2020-05-16 LAB — PSA: Prostate Specific Ag, Serum: 0.5 ng/mL (ref 0.0–4.0)

## 2020-05-27 ENCOUNTER — Ambulatory Visit (INDEPENDENT_AMBULATORY_CARE_PROVIDER_SITE_OTHER): Payer: 59 | Admitting: Family Medicine

## 2020-05-27 ENCOUNTER — Encounter: Payer: Self-pay | Admitting: Family Medicine

## 2020-05-27 ENCOUNTER — Other Ambulatory Visit: Payer: Self-pay

## 2020-05-27 VITALS — BP 131/84 | HR 101 | Temp 95.4°F | Ht 68.25 in | Wt 209.0 lb

## 2020-05-27 DIAGNOSIS — Z0000 Encounter for general adult medical examination without abnormal findings: Secondary | ICD-10-CM | POA: Diagnosis not present

## 2020-05-27 DIAGNOSIS — F5101 Primary insomnia: Secondary | ICD-10-CM

## 2020-05-27 DIAGNOSIS — R0989 Other specified symptoms and signs involving the circulatory and respiratory systems: Secondary | ICD-10-CM

## 2020-05-27 DIAGNOSIS — R198 Other specified symptoms and signs involving the digestive system and abdomen: Secondary | ICD-10-CM | POA: Diagnosis not present

## 2020-05-27 MED ORDER — AMITRIPTYLINE HCL 75 MG PO TABS: 75.0000 mg | ORAL_TABLET | Freq: Every day | ORAL | 3 refills | Status: DC

## 2020-05-27 NOTE — Progress Notes (Signed)
Patient ID: Cristian Davis, male    DOB: 03/24/1956, 65 y.o.   MRN: [id number]   Chief Complaint  Patient presents with  . Annual Exam   Subjective:    HPI The patient comes in today for a wellness visit.  A review of their health history was completed.  A review of medications was also completed.  Any needed refills; Amitriptyline   Eating habits: healthy eating   Falls/  MVA accidents in past few months: none  Regular exercise: none  Specialist pt sees on regular basis: none  Preventative health issues were discussed.   Additional concerns: none Not drinking much water.  Drinking diet mountain dew and caffeine free.   Anxiety/chronic globus sensation/chronic throat clearing- Pt taking elavil at night and tried to come off it. Felt some lightheaded and dizziness and anxiety attacks, then restarted it and it resolved. Feeling better on it.   Medical History Javontae has a past medical history of Anxiety, adenomatous polyp of colon (08/03/2016), Impaired fasting glucose, Insomnia, and Vitiligo.   Outpatient Encounter Medications as of 05/27/2020  Medication Sig  . [DISCONTINUED] amitriptyline (ELAVIL) 75 MG tablet Take 1 tablet (75 mg total) by mouth at bedtime.  Marland Kitchen amitriptyline (ELAVIL) 75 MG tablet Take 1 tablet (75 mg total) by mouth at bedtime.   No facility-administered encounter medications on file as of 05/27/2020.     Review of Systems  Constitutional: Negative for chills and fever.  HENT: Negative for congestion, rhinorrhea and sore throat.   Respiratory: Negative for cough, shortness of breath and wheezing.   Cardiovascular: Negative for chest pain and leg swelling.  Gastrointestinal: Negative for abdominal pain, diarrhea, nausea and vomiting.  Genitourinary: Negative for dysuria and frequency.  Skin: Negative for rash.  Neurological: Negative for dizziness, weakness and headaches.  Psychiatric/Behavioral: Positive for sleep disturbance. Negative for  dysphoric mood, self-injury and suicidal ideas. The patient is nervous/anxious (controlled on meds).      Vitals BP 131/84   Pulse (!) 101   Temp (!) 95.4 F (35.2 C)   Ht 5' 8.25" (1.734 m)   Wt 209 lb (94.8 kg)   SpO2 96%   BMI 31.55 kg/m   Objective:   Physical Exam Vitals and nursing note reviewed.  Constitutional:      General: He is not in acute distress.    Appearance: Normal appearance. He is not ill-appearing.  HENT:     Head: Normocephalic.     Nose: Nose normal. No congestion.     Mouth/Throat:     Mouth: Mucous membranes are moist.     Pharynx: No oropharyngeal exudate.  Eyes:     Extraocular Movements: Extraocular movements intact.     Conjunctiva/sclera: Conjunctivae normal.     Pupils: Pupils are equal, round, and reactive to light.  Cardiovascular:     Rate and Rhythm: Normal rate and regular rhythm.     Pulses: Normal pulses.     Heart sounds: Normal heart sounds. No murmur heard.   Pulmonary:     Effort: Pulmonary effort is normal.     Breath sounds: Normal breath sounds. No wheezing, rhonchi or rales.  Musculoskeletal:        General: Normal range of motion.     Right lower leg: No edema.     Left lower leg: No edema.  Skin:    General: Skin is warm and dry.     Findings: No rash.  Neurological:     General: No  focal deficit present.     Mental Status: He is alert and oriented to person, place, and time.     Cranial Nerves: No cranial nerve deficit.  Psychiatric:        Mood and Affect: Mood normal.        Behavior: Behavior normal.        Thought Content: Thought content normal.        Judgment: Judgment normal.      Assessment and Plan   1. Well adult exam  2. Primary insomnia - amitriptyline (ELAVIL) 75 MG tablet; Take 1 tablet (75 mg total) by mouth at bedtime.  Dispense: 90 tablet; Refill: 3  3. Globus sensation ? - amitriptyline (ELAVIL) 75 MG tablet; Take 1 tablet (75 mg total) by mouth at bedtime.  Dispense: 90 tablet;  Refill: 3   Doing well on meds.  Will cont elavil.   F/u 54mo or prn.

## 2020-12-22 ENCOUNTER — Ambulatory Visit
Admission: EM | Admit: 2020-12-22 | Discharge: 2020-12-22 | Disposition: A | Discharge status: Home / Self Care | Payer: 59 | Attending: Family Medicine | Admitting: Family Medicine

## 2020-12-22 ENCOUNTER — Other Ambulatory Visit: Payer: Self-pay

## 2020-12-22 ENCOUNTER — Telehealth: Payer: 59

## 2020-12-22 ENCOUNTER — Encounter: Payer: Self-pay | Admitting: Emergency Medicine

## 2020-12-22 DIAGNOSIS — J029 Acute pharyngitis, unspecified: Secondary | ICD-10-CM

## 2020-12-22 DIAGNOSIS — U071 COVID-19: Secondary | ICD-10-CM | POA: Diagnosis not present

## 2020-12-22 NOTE — Discharge Instructions (Addendum)
You may use over the counter ibuprofen or acetaminophen as needed.  °For a sore throat, over the counter products such as [employer] Peroxyl Mouth Sore Rinse or Chloraseptic Sore Throat Spray may provide some temporary relief. ° ° ° ° °

## 2020-12-22 NOTE — ED Triage Notes (Signed)
Patient states that he tested positive for COVID this morning via a home test.  Patient began with a headache, sore throat since Sunday.  Patient is vaccinated for COVID.

## 2020-12-22 NOTE — ED Provider Notes (Signed)
  [place]   [id number] 12/22/20 Arrival Time: [id number]  ASSESSMENT & PLAN:  1. COVID-19 virus infection   2. Sore throat    Discussed typical duration of viral illnesses. Discussed Paxlovid; he prefers to let this run its course. OTC symptom care as needed.    Follow-up Information     Elvia Collum M, DO.   Specialty: Family Medicine Why: As needed. Contact information: [place] [id number] [phone]                 Reviewed expectations re: course of current medical issues. Questions answered. Outlined signs and symptoms indicating need for more acute intervention. Understanding verbalized. After Visit Summary given.   SUBJECTIVE: History from: patient. Cristian Davis is a 65 y.o. male who presents with worries regarding COVID-19. Known COVID-19 contact: wife. Recent travel: none. Reports: ST and headache. COVID test + at home this morning. Denies: fever, cough, and difficulty breathing. Normal PO intake without n/v/d.   OBJECTIVE:  Vitals:   12/22/20 1127  BP: 121/76  Pulse: (!) 104  Resp: 20  Temp: 98.7 F (37.1 C)  TempSrc: Oral  SpO2: 96%  Weight: 93 kg  Height: '5\' 9"'$  (1.753 m)    General appearance: alert; no distress Eyes: PERRLA; EOMI; conjunctiva normal HENT: [place]; AT; with nasal congestion; throat irritation Neck: supple  Lungs: speaks full sentences without difficulty; unlabored Extremities: no edema Skin: warm and dry Neurologic: normal gait Psychological: alert and cooperative; normal mood and affect   No Known Allergies  Past Medical History:  Diagnosis Date   Anxiety    Hx of adenomatous polyp of colon 08/03/2016   Impaired fasting glucose    Insomnia    Vitiligo    Social History   Socioeconomic History   Marital status: Married    Spouse name: Not on file   Number of children: Not on file   Years of education: Not on file   Highest education level: Not on file  Occupational History     Employer: [employer]  Tobacco Use   Smoking status: Never   Smokeless tobacco: Never  Substance and Sexual Activity   Alcohol use: Yes    Comment: occasional   Drug use: No   Sexual activity: Not on file  Other Topics Concern   Not on file  Social History Narrative   Married   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    Never smoker, occ EtOH, no drugs   Social Determinants of Health   Financial Resource Strain: Not on file  Food Insecurity: Not on file  Transportation Needs: Not on file  Physical Activity: Not on file  Stress: Not on file  Social Connections: Not on file  Intimate Partner Violence: Not on file   Family History  Problem Relation Age of Onset   Heart attack Father    Cancer Father    Heart attack Sister    Colon cancer Neg Hx    Esophageal cancer Neg Hx    Rectal cancer Neg Hx    Stomach cancer Neg Hx    Past Surgical History:  Procedure Laterality Date   COLONOSCOPY  2007   repeated 2018   ESOPHAGOGASTRODUODENOSCOPY  03/31/2017   lasic eye surgery Bilateral    WRIST SURGERY  88-89   cysts removed from wrist     Vanessa Kick, MD 12/22/20 1215

## 2021-03-09 ENCOUNTER — Other Ambulatory Visit: Payer: Self-pay

## 2021-03-09 ENCOUNTER — Ambulatory Visit: Payer: 59 | Admitting: Nurse Practitioner

## 2021-03-09 ENCOUNTER — Encounter: Payer: Self-pay | Admitting: Nurse Practitioner

## 2021-03-09 VITALS — BP 138/78 | HR 101 | Temp 97.2°F | Ht 69.0 in | Wt 214.0 lb

## 2021-03-09 DIAGNOSIS — I10 Essential (primary) hypertension: Secondary | ICD-10-CM | POA: Diagnosis not present

## 2021-03-09 MED ORDER — VALSARTAN 80 MG PO TABS: 80.0000 mg | ORAL_TABLET | Freq: Every day | ORAL | 3 refills | Status: DC

## 2021-03-09 NOTE — Progress Notes (Signed)
   Subjective:    Patient ID: Cristian Davis, male    DOB: January 13, 1956, 65 y.o.   MRN: [id number]  HPI  Patient reports today with concerns regarding his BP. Patient reports elevated BP readings at home, with an avg of 145-150/89. Patient also reports having headaches when BP is elevated. Patient states that headaches feeling like a dull squeezing across his forehead that is relieved by rest.   Not currently on any medications for BP.   Review of Systems  Eyes:  Negative for visual disturbance.  Respiratory:  Negative for shortness of breath.   Cardiovascular:  Negative for chest pain and palpitations.  Gastrointestinal:  Negative for nausea and vomiting.  Neurological:  Positive for headaches. Negative for syncope, weakness and light-headedness.      Objective:   Physical Exam Constitutional:      Appearance: Normal appearance.  Cardiovascular:     Rate and Rhythm: Normal rate and regular rhythm.     Heart sounds: No murmur heard. Pulmonary:     Effort: Pulmonary effort is normal. No respiratory distress.     Breath sounds: Normal breath sounds. No wheezing or rales.  Musculoskeletal:        General: No swelling.  Skin:    General: Skin is warm.  Neurological:     General: No focal deficit present.     Mental Status: He is alert and oriented to person, place, and time.  Psychiatric:        Mood and Affect: Mood normal.        Behavior: Behavior normal.      Assessment & Plan:   1. Primary hypertension - BP 148/78 at visit and 138/78 on recheck - Start Valsartan $RemoveBeforeD'80mg'wsKztxPbzWJjXe$  daily. - CMP14+EGFR - CBC with Differential - HgB A1c - Lipid Panel With LDL/HDL Ratio - RTC in 2 weeks for BP recheck - Review s/s of hypotension. If patient experiences dizziness, lightheadedness, or low BP may split Valsartan dose in half and report to clinic.

## 2021-03-10 LAB — CBC WITH DIFFERENTIAL/PLATELET
Basophils Absolute: 0 10*3/uL (ref 0.0–0.2)
Basos: 0 %
EOS (ABSOLUTE): 0.1 10*3/uL (ref 0.0–0.4)
Eos: 2 %
Hematocrit: 45.7 % (ref 37.5–51.0)
Hemoglobin: 16.7 g/dL (ref 13.0–17.7)
Immature Grans (Abs): 0 10*3/uL (ref 0.0–0.1)
Immature Granulocytes: 0 %
Lymphocytes Absolute: 1.2 10*3/uL (ref 0.7–3.1)
Lymphs: 24 %
MCH: 34 pg — ABNORMAL HIGH (ref 26.6–33.0)
MCHC: 36.5 g/dL — ABNORMAL HIGH (ref 31.5–35.7)
MCV: 93 fL (ref 79–97)
Monocytes Absolute: 0.5 10*3/uL (ref 0.1–0.9)
Monocytes: 10 %
Neutrophils Absolute: 3.3 10*3/uL (ref 1.4–7.0)
Neutrophils: 64 %
Platelets: 137 10*3/uL — ABNORMAL LOW (ref 150–450)
RBC: 4.91 x10E6/uL (ref 4.14–5.80)
RDW: 13.3 % (ref 11.6–15.4)
WBC: 5.3 10*3/uL (ref 3.4–10.8)

## 2021-03-10 LAB — CMP14+EGFR
ALT: 35 IU/L (ref 0–44)
AST: 31 IU/L (ref 0–40)
Albumin/Globulin Ratio: 2.6 — ABNORMAL HIGH (ref 1.2–2.2)
Albumin: 5 g/dL — ABNORMAL HIGH (ref 3.8–4.8)
Alkaline Phosphatase: 120 IU/L (ref 44–121)
BUN/Creatinine Ratio: 17 (ref 10–24)
BUN: 19 mg/dL (ref 8–27)
Bilirubin Total: 0.8 mg/dL (ref 0.0–1.2)
CO2: 25 mmol/L (ref 20–29)
Calcium: 9.3 mg/dL (ref 8.6–10.2)
Chloride: 102 mmol/L (ref 96–106)
Creatinine, Ser: 1.13 mg/dL (ref 0.76–1.27)
Globulin, Total: 1.9 g/dL (ref 1.5–4.5)
Glucose: 113 mg/dL — ABNORMAL HIGH (ref 70–99)
Potassium: 4.4 mmol/L (ref 3.5–5.2)
Sodium: 140 mmol/L (ref 134–144)
Total Protein: 6.9 g/dL (ref 6.0–8.5)
eGFR: 72 mL/min/{1.73_m2} (ref >59)

## 2021-03-10 LAB — LIPID PANEL WITH LDL/HDL RATIO
Cholesterol, Total: 183 mg/dL (ref 100–199)
HDL: 44 mg/dL (ref >39)
LDL Chol Calc (NIH): 93 mg/dL (ref 0–99)
LDL/HDL Ratio: 2.1 ratio (ref 0.0–3.6)
Triglycerides: 276 mg/dL — ABNORMAL HIGH (ref 0–149)
VLDL Cholesterol Cal: 46 mg/dL — ABNORMAL HIGH (ref 5–40)

## 2021-03-10 LAB — HEMOGLOBIN A1C
Est. average glucose Bld gHb Est-mCnc: 94 mg/dL
Hgb A1c MFr Bld: 4.9 % (ref 4.8–5.6)

## 2021-03-10 NOTE — Progress Notes (Signed)
Reviewed labs. Glucose slightly elevated which is normal after eating. A1C is 4.9 which is perfect. I have no concerns regarding prediabetes or diabetes.   CBC normal. Platelets a little low but no concerning at this time.   Cholesterol looks good except Triglycerides. Triglycerides are a little elevated but can be improved with diet and exercise. Eat a well balanced diet and try to exercise for at least 30 minutes a day 3-5x a week.   Will discuss more in detail during visit on 12/9. Happy Thanksgiving!

## 2021-03-26 ENCOUNTER — Telehealth: Payer: Self-pay | Admitting: Family Medicine

## 2021-03-26 ENCOUNTER — Ambulatory Visit: Payer: 59 | Admitting: Family Medicine

## 2021-03-26 ENCOUNTER — Other Ambulatory Visit: Payer: Self-pay

## 2021-03-26 VITALS — BP 123/84 | HR 82 | Ht 69.0 in | Wt 213.6 lb

## 2021-03-26 DIAGNOSIS — I10 Essential (primary) hypertension: Secondary | ICD-10-CM

## 2021-03-26 NOTE — Patient Instructions (Signed)
BP looks good.  Lab today.  Continue your medication as prescribed.  Follow up in 6 months.

## 2021-03-26 NOTE — Telephone Encounter (Signed)
Patient has physical in February and needing labs

## 2021-03-26 NOTE — Assessment & Plan Note (Signed)
Stable/well-controlled.  Continue valsartan.  BMP today.

## 2021-03-26 NOTE — Progress Notes (Signed)
Subjective:  Patient ID: Cristian Davis, male    DOB: [birth year]  Age: 65 y.o. MRN: [id number]  CC: Chief Complaint  Patient presents with   Hypertension    Follow up on blood pressure. Has some questions about blood work     HPI:  65 year old male presents for follow up regarding hypertension.  Essential Hypertension Recently started on Valsartan. BP at goal today. He is tolerating valsartan well.  No reported adverse side effects. He is feeling well.  No chest pain or shortness of breath.  Patient Active Problem List   Diagnosis Date Noted   Essential hypertension 03/26/2021   Hx of adenomatous polyp of colon 08/03/2016   Insomnia 02/18/2013   Vitiligo 02/18/2013   Family history of prostate cancer 02/18/2013    Social Hx   Social History   Socioeconomic History   Marital status: Married    Spouse name: Not on file   Number of children: Not on file   Years of education: Not on file   Highest education level: Not on file  Occupational History    Employer: [employer]  Tobacco Use   Smoking status: Never   Smokeless tobacco: Never  Substance and Sexual Activity   Alcohol use: Yes    Comment: occasional   Drug use: No   Sexual activity: Not on file  Other Topics Concern   Not on file  Social History Narrative   Married   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 (Lbs) 213.6 214 205  BMI 31.54 31.6 30.27    Physical Exam Vitals and nursing note reviewed.   Constitutional:      General: He is not in acute distress.    Appearance: Normal appearance. He is not ill-appearing.  HENT:     Head: Normocephalic and atraumatic.  Cardiovascular:     Rate and Rhythm: Normal rate and regular rhythm.  Pulmonary:     Effort: Pulmonary effort is normal.     Breath sounds: Normal breath sounds. No wheezing, rhonchi or rales.  Neurological:     Mental Status: He is alert.  Psychiatric:        Mood and Affect: Mood normal.        Behavior: Behavior normal.    Lab Results  Component Value Date   WBC 5.3 03/09/2021   HGB 16.7 03/09/2021   HCT 45.7 03/09/2021   PLT 137 (L) 03/09/2021   GLUCOSE 113 (H) 03/09/2021   CHOL 183 03/09/2021   TRIG 276 (H) 03/09/2021   HDL 44 03/09/2021   LDLCALC 93 03/09/2021   ALT 35 03/09/2021   AST 31 03/09/2021   NA 140 03/09/2021   K 4.4 03/09/2021   CL 102 03/09/2021   CREATININE 1.13 03/09/2021   BUN 19 03/09/2021   CO2 25 03/09/2021   PSA 0.46 02/07/2014  HGBA1C 4.9 03/09/2021     Assessment & Plan:   Problem List Items Addressed This Visit       Cardiovascular and Mediastinum   Essential hypertension - Primary    Stable/well-controlled.  Continue valsartan.  BMP today.      Relevant Orders   Basic Metabolic Panel (BMET)    Follow-up:  Return in about 6 months (around 09/24/2021).  [place]

## 2021-03-27 LAB — BASIC METABOLIC PANEL
BUN/Creatinine Ratio: 16 (ref 10–24)
BUN: 20 mg/dL (ref 8–27)
CO2: 25 mmol/L (ref 20–29)
Calcium: 9.8 mg/dL (ref 8.6–10.2)
Chloride: 101 mmol/L (ref 96–106)
Creatinine, Ser: 1.24 mg/dL (ref 0.76–1.27)
Glucose: 108 mg/dL — ABNORMAL HIGH (ref 70–99)
Potassium: 4.6 mmol/L (ref 3.5–5.2)
Sodium: 140 mmol/L (ref 134–144)
eGFR: 65 mL/min/{1.73_m2} (ref >59)

## 2021-03-29 NOTE — Telephone Encounter (Signed)
Coral Spikes, DO    He has had recent labs. Doesn't need any labs in Feb.

## 2021-03-29 NOTE — Telephone Encounter (Signed)
Patient has been informed per drs notes. 

## 2021-05-24 ENCOUNTER — Encounter: Payer: Self-pay | Admitting: Family Medicine

## 2021-05-24 DIAGNOSIS — Z125 Encounter for screening for malignant neoplasm of prostate: Secondary | ICD-10-CM

## 2021-05-28 ENCOUNTER — Other Ambulatory Visit: Payer: Self-pay

## 2021-05-28 ENCOUNTER — Ambulatory Visit (INDEPENDENT_AMBULATORY_CARE_PROVIDER_SITE_OTHER): Payer: 59 | Admitting: Family Medicine

## 2021-05-28 VITALS — BP 121/77 | HR 89 | Temp 98.0°F | Ht 68.0 in | Wt 214.2 lb

## 2021-05-28 DIAGNOSIS — Z23 Encounter for immunization: Secondary | ICD-10-CM

## 2021-05-28 DIAGNOSIS — Z0000 Encounter for general adult medical examination without abnormal findings: Secondary | ICD-10-CM | POA: Diagnosis not present

## 2021-05-28 MED ORDER — CETIRIZINE HCL 10 MG PO TABS: 10.0000 mg | ORAL_TABLET | Freq: Every day | ORAL | 1 refills | Status: DC

## 2021-05-28 MED ORDER — FLUTICASONE PROPIONATE 50 MCG/ACT NA SUSP: 2.0000 | Freq: Every day | NASAL | 6 refills | Status: DC

## 2021-05-28 NOTE — Assessment & Plan Note (Signed)
Patient is doing well.  PSA today.  Recent labs reviewed.  Preventative health care updated today.  Prevnar given.

## 2021-05-28 NOTE — Progress Notes (Signed)
Subjective:  Patient ID: Cristian Davis, male    DOB: [birth year]  Age: 67 y.o. MRN: [id number]  CC: Chief Complaint  Patient presents with   Annual Exam    HPI:  66 year old male with hypertension, vitiligo, insomnia presents for an annual physical.  Patient states that he is doing well.  He states that he is currently having trouble with suspected reflux.  Has not really responded to medications either prescription or over-the-counter.  He describes his symptoms as feeling the need to clear his throat in the morning.  No heartburn.  No abdominal pain.  No belching.  Preventative Healthcare Colonoscopy: Up-to-date. Immunizations Tetanus -up-to-date. Pneumococcal -Prevnar 20 today. Flu -up-to-date. Shingles -up-to-date. Prostate cancer screening: PSA ordered. Hepatitis C screening -declines. Labs: Has had recent labs. Alcohol use: Occasional. Smoking/tobacco use: No. STD/HIV testing: Declines.  Patient Active Problem List   Diagnosis Date Noted   Annual physical exam 05/28/2021   Essential hypertension 03/26/2021   Hx of adenomatous polyp of colon 08/03/2016   Insomnia 02/18/2013   Vitiligo 02/18/2013   Family history of prostate cancer 02/18/2013    Social Hx   Social History   Socioeconomic History   Marital status: Married    Spouse name: Not on file   Number of children: Not on file   Years of education: Not on file   Highest education level: Not on file  Occupational History    Employer: [employer]  Tobacco Use   Smoking status: Never   Smokeless tobacco: Never  Substance and Sexual Activity   Alcohol use: Yes    Comment: occasional   Drug use: No   Sexual activity: Not on file  Other Topics Concern   Not on file  Social History Narrative   Married   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 (Lbs) 214.2 213.6 214  BMI 32.57 31.54 31.6    Physical Exam Vitals and nursing note reviewed.  Constitutional:      General: He is not in acute distress.    Appearance: Normal appearance. He is not ill-appearing.  HENT:     Head: Normocephalic and atraumatic.  Eyes:     General:        Right eye: No discharge.        Left eye: No discharge.     Conjunctiva/sclera: Conjunctivae normal.  Cardiovascular:     Rate and Rhythm: Normal rate and regular rhythm.  Pulmonary:     Effort: Pulmonary effort is normal.     Breath sounds: Normal breath sounds. No wheezing or rales.  Abdominal:     General: There is no distension.     Palpations: Abdomen is soft.     Tenderness: There is no abdominal tenderness.  Neurological:  Mental Status: He is alert.  Psychiatric:        Mood and Affect: Mood normal.        Behavior: Behavior normal.    Lab Results  Component Value Date   WBC 5.3 03/09/2021   HGB 16.7 03/09/2021   HCT 45.7 03/09/2021   PLT 137 (L) 03/09/2021   GLUCOSE 108 (H) 03/26/2021   CHOL 183 03/09/2021   TRIG 276 (H) 03/09/2021   HDL 44 03/09/2021   LDLCALC 93 03/09/2021   ALT 35 03/09/2021   AST 31 03/09/2021   NA 140 03/26/2021   K 4.6 03/26/2021   CL 101 03/26/2021   CREATININE 1.24 03/26/2021   BUN 20 03/26/2021   CO2 25 03/26/2021   PSA 0.46 02/07/2014   HGBA1C 4.9 03/09/2021     Assessment & Plan:   Problem List Items Addressed This Visit       Other   Annual physical exam - Primary    Patient is doing well.  PSA today.  Recent labs reviewed.  Preventative health care  updated today.  Prevnar given.      Other Visit Diagnoses     Need for vaccination       Relevant Orders   Pneumococcal conjugate vaccine 20-valent (Prevnar 20) (Completed)       Meds ordered this encounter  Medications   cetirizine (ZYRTEC ALLERGY) 10 MG tablet    Sig: Take 1 tablet (10 mg total) by mouth daily.    Dispense:  30 tablet    Refill:  1   fluticasone (FLONASE) 50 MCG/ACT nasal spray    Sig: Place 2 sprays into both nostrils daily.    Dispense:  16 g    Refill:  6    Follow-up:  Return in about 6 months (around 11/25/2021).  Jeddito

## 2021-05-28 NOTE — Patient Instructions (Signed)
Meds as directed.  PSA today.  Follow up in 6 months to 1 year.  Take care  Dr. Lacinda Axon

## 2021-05-29 LAB — PSA: Prostate Specific Ag, Serum: 0.6 ng/mL (ref 0.0–4.0)

## 2021-06-28 ENCOUNTER — Other Ambulatory Visit: Payer: Self-pay | Admitting: Family Medicine

## 2021-06-28 DIAGNOSIS — F5101 Primary insomnia: Secondary | ICD-10-CM

## 2021-06-28 DIAGNOSIS — R0989 Other specified symptoms and signs involving the circulatory and respiratory systems: Secondary | ICD-10-CM

## 2021-07-08 ENCOUNTER — Other Ambulatory Visit: Payer: Self-pay | Admitting: *Deleted

## 2021-07-08 DIAGNOSIS — R0989 Other specified symptoms and signs involving the circulatory and respiratory systems: Secondary | ICD-10-CM

## 2021-07-08 DIAGNOSIS — F5101 Primary insomnia: Secondary | ICD-10-CM

## 2021-07-08 MED ORDER — AMITRIPTYLINE HCL 75 MG PO TABS: 75.0000 mg | ORAL_TABLET | Freq: Every day | ORAL | 1 refills | Status: DC

## 2021-07-12 ENCOUNTER — Other Ambulatory Visit: Payer: Self-pay

## 2021-07-12 DIAGNOSIS — F5101 Primary insomnia: Secondary | ICD-10-CM

## 2021-07-12 DIAGNOSIS — R0989 Other specified symptoms and signs involving the circulatory and respiratory systems: Secondary | ICD-10-CM

## 2021-07-12 MED ORDER — AMITRIPTYLINE HCL 75 MG PO TABS: 75.0000 mg | ORAL_TABLET | Freq: Every day | ORAL | 1 refills | Status: DC

## 2021-09-06 ENCOUNTER — Other Ambulatory Visit: Payer: Self-pay | Admitting: Nurse Practitioner

## 2021-09-09 ENCOUNTER — Other Ambulatory Visit: Payer: Self-pay | Admitting: Family Medicine

## 2021-11-03 ENCOUNTER — Other Ambulatory Visit: Payer: Self-pay

## 2021-11-03 ENCOUNTER — Other Ambulatory Visit: Payer: Self-pay | Admitting: Family Medicine

## 2021-11-03 MED ORDER — VALSARTAN 80 MG PO TABS: 80.0000 mg | ORAL_TABLET | Freq: Every day | ORAL | 0 refills | Status: DC

## 2021-11-26 ENCOUNTER — Encounter: Payer: Self-pay | Admitting: Family Medicine

## 2021-11-26 ENCOUNTER — Ambulatory Visit: Payer: 59 | Admitting: Family Medicine

## 2021-11-26 VITALS — BP 130/74 | HR 93 | Temp 97.8°F | Wt 215.0 lb

## 2021-11-26 DIAGNOSIS — I10 Essential (primary) hypertension: Secondary | ICD-10-CM

## 2021-11-26 DIAGNOSIS — F5101 Primary insomnia: Secondary | ICD-10-CM

## 2021-11-26 DIAGNOSIS — E782 Mixed hyperlipidemia: Secondary | ICD-10-CM | POA: Diagnosis not present

## 2021-11-26 DIAGNOSIS — Z130 Encounter for screening for diseases of the blood and blood-forming organs and certain disorders involving the immune mechanism: Secondary | ICD-10-CM | POA: Diagnosis not present

## 2021-11-26 NOTE — Patient Instructions (Signed)
Labs when you like.  Continue current medications.  Follow-up in 6 months to 1 year  Take care  Dr. Lacinda Axon

## 2021-11-26 NOTE — Progress Notes (Signed)
Subjective:  Patient ID: Cristian Davis, male    DOB: [birth year]  Age: 66 y.o. MRN: [id number]  CC: Chief Complaint  Patient presents with   Hypertension    No issues/concerns at this time. Taking meds as directed     HPI:  66 year old male presents for follow-up.  Patient states that he is feeling well.  He has no complaints or concerns at this time.  Denies chest pain or shortness of breath.  No issues with his vision or his hearing.  Eating well.  Hypertension is well-controlled on valsartan.  He is tolerating well.  Insomnia controlled with amitriptyline.  Doing well.   Patient Active Problem List   Diagnosis Date Noted   Mixed hyperlipidemia 11/26/2021   Essential hypertension 03/26/2021   Hx of adenomatous polyp of colon 08/03/2016   Insomnia 02/18/2013   Vitiligo 02/18/2013    Social Hx   Social History   Socioeconomic History   Marital status: Married    Spouse name: Not on file   Number of children: Not on file   Years of education: Not on file   Highest education level: Not on file  Occupational History    Employer: [employer]  Tobacco Use   Smoking status: Never   Smokeless tobacco: Never  Substance and Sexual Activity   Alcohol use: Yes    Comment: occasional   Drug use: No   Sexual activity: Not on file  Other Topics Concern   Not on file  Social History Narrative   Married   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 (Lbs) 215 214.2 213.6  BMI 32.69 kg/m2 32.57 kg/m2 31.54 kg/m2    Physical Exam Vitals and nursing note reviewed.  Constitutional:      General: He is not in acute distress.    Appearance: Normal appearance.  HENT:     Head: Normocephalic and atraumatic.  Eyes:     General:        Right eye: No discharge.        Left eye: No discharge.     Conjunctiva/sclera: Conjunctivae normal.  Cardiovascular:     Rate and Rhythm: Normal rate and regular rhythm.  Pulmonary:     Effort: Pulmonary effort is normal.     Breath sounds: Normal breath sounds. No wheezing, rhonchi or rales.  Abdominal:     General: There is no distension.     Palpations: Abdomen is soft.     Tenderness: There is no abdominal tenderness.  Neurological:     Mental Status: He is alert.  Psychiatric:        Mood and Affect: Mood normal.        Behavior: Behavior normal.     Lab Results  Component  Value Date   WBC 5.3 03/09/2021   HGB 16.7 03/09/2021   HCT 45.7 03/09/2021   PLT 137 (L) 03/09/2021   GLUCOSE 108 (H) 03/26/2021   CHOL 183 03/09/2021   TRIG 276 (H) 03/09/2021   HDL 44 03/09/2021   LDLCALC 93 03/09/2021   ALT 35 03/09/2021   AST 31 03/09/2021   NA 140 03/26/2021   K 4.6 03/26/2021   CL 101 03/26/2021   CREATININE 1.24 03/26/2021   BUN 20 03/26/2021   CO2 25 03/26/2021   PSA 0.46 02/07/2014   HGBA1C 4.9 03/09/2021     Assessment & Plan:   Problem List Items Addressed This Visit       Cardiovascular and Mediastinum   Essential hypertension - Primary    Stable.  Continue valsartan.      Relevant Orders   CMP14+EGFR     Other   Insomnia    Stable. Continue Amitriptyline.       Mixed hyperlipidemia    Lipid panel ordered.       Relevant Orders   Lipid panel   Other Visit Diagnoses     Screening for deficiency anemia       Relevant Orders   CBC      Follow-up:  6 months to 1 year.   [place]

## 2021-11-26 NOTE — Assessment & Plan Note (Signed)
Stable.  Continue valsartan. 

## 2021-11-26 NOTE — Assessment & Plan Note (Signed)
Lipid panel ordered.

## 2021-11-26 NOTE — Assessment & Plan Note (Signed)
Stable. Continue Amitriptyline.

## 2021-12-18 LAB — LIPID PANEL
Chol/HDL Ratio: 4 ratio (ref 0.0–5.0)
Cholesterol, Total: 166 mg/dL (ref 100–199)
HDL: 41 mg/dL (ref >39)
LDL Chol Calc (NIH): 100 mg/dL — ABNORMAL HIGH (ref 0–99)
Triglycerides: 141 mg/dL (ref 0–149)
VLDL Cholesterol Cal: 25 mg/dL (ref 5–40)

## 2021-12-18 LAB — CBC
Hematocrit: 48.4 % (ref 37.5–51.0)
Hemoglobin: 17.2 g/dL (ref 13.0–17.7)
MCH: 33.6 pg — ABNORMAL HIGH (ref 26.6–33.0)
MCHC: 35.5 g/dL (ref 31.5–35.7)
MCV: 95 fL (ref 79–97)
Platelets: 142 10*3/uL — ABNORMAL LOW (ref 150–450)
RBC: 5.12 x10E6/uL (ref 4.14–5.80)
RDW: 12.7 % (ref 11.6–15.4)
WBC: 4.5 10*3/uL (ref 3.4–10.8)

## 2021-12-18 LAB — CMP14+EGFR
ALT: 32 IU/L (ref 0–44)
AST: 33 IU/L (ref 0–40)
Albumin/Globulin Ratio: 2.5 — ABNORMAL HIGH (ref 1.2–2.2)
Albumin: 4.9 g/dL (ref 3.9–4.9)
Alkaline Phosphatase: 108 IU/L (ref 44–121)
BUN/Creatinine Ratio: 13 (ref 10–24)
BUN: 16 mg/dL (ref 8–27)
Bilirubin Total: 0.9 mg/dL (ref 0.0–1.2)
CO2: 23 mmol/L (ref 20–29)
Calcium: 9.7 mg/dL (ref 8.6–10.2)
Chloride: 103 mmol/L (ref 96–106)
Creatinine, Ser: 1.24 mg/dL (ref 0.76–1.27)
Globulin, Total: 2 g/dL (ref 1.5–4.5)
Glucose: 106 mg/dL — ABNORMAL HIGH (ref 70–99)
Potassium: 4.6 mmol/L (ref 3.5–5.2)
Sodium: 142 mmol/L (ref 134–144)
Total Protein: 6.9 g/dL (ref 6.0–8.5)
eGFR: 65 mL/min/{1.73_m2} (ref >59)

## 2022-01-14 ENCOUNTER — Encounter: Payer: Self-pay | Admitting: Family Medicine

## 2022-01-14 ENCOUNTER — Ambulatory Visit: Payer: 59 | Admitting: Family Medicine

## 2022-01-14 VITALS — BP 124/78 | HR 94 | Temp 97.9°F | Ht 68.0 in | Wt 212.0 lb

## 2022-01-14 DIAGNOSIS — J209 Acute bronchitis, unspecified: Secondary | ICD-10-CM | POA: Diagnosis not present

## 2022-01-14 MED ORDER — AMOXICILLIN-POT CLAVULANATE 875-125 MG PO TABS: 1.0000 | ORAL_TABLET | Freq: Two times a day (BID) | ORAL | 0 refills | Status: DC

## 2022-01-14 MED ORDER — PROMETHAZINE-DM 6.25-15 MG/5ML PO SYRP: 5.0000 mL | ORAL_SOLUTION | Freq: Four times a day (QID) | ORAL | 0 refills | Status: DC | PRN

## 2022-01-14 NOTE — Progress Notes (Signed)
Subjective:  Patient ID: Cristian Davis, male    DOB: Apr 17, 1956  Age: 66 y.o. MRN: [id number]  CC: Chief Complaint  Patient presents with   Cough   Sore Throat    Cough/sore throat x 1 week, no fever.    HPI:  66 year old male presents for the evaluation of the above.  Symptoms since last Wednesday. Reports cough, sore throat. Cough is nonproductive. No relief with OTC treatment. No fever. No SOB. No other associated symptoms. No reported sick contacts.  Patient Active Problem List   Diagnosis Date Noted   Acute bronchitis 01/14/2022   Mixed hyperlipidemia 11/26/2021   Essential hypertension 03/26/2021   Hx of adenomatous polyp of colon 08/03/2016   Insomnia 02/18/2013   Vitiligo 02/18/2013    Social Hx   Social History   Socioeconomic History   Marital status: Married    Spouse name: Not on file   Number of children: Not on file   Years of education: Not on file   Highest education level: Not on file  Occupational History    Employer: [employer]  Tobacco Use   Smoking status: Never   Smokeless tobacco: Never  Substance and Sexual Activity   Alcohol use: Yes    Comment: occasional   Drug use: No   Sexual activity: Not on file  Other Topics Concern   Not on file  Social History Narrative   Married   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    Never smoker, occ EtOH, no drugs   Social Determinants of Radio broadcast assistant Strain: Not on file  Food Insecurity: Not on file  Transportation Needs: Not on file  Physical Activity: Not on file  Stress: Not on file  Social Connections: Not on file    Review of Systems Per HPI  Objective:  BP 124/78 (BP Location: Right Arm, Patient Position: Sitting, Cuff Size: Large)   Pulse 94   Temp 97.9 F (36.6 C) (Temporal)   Ht '5\' 8"'$  (1.727 m)   Wt 212 lb (96.2 kg)   SpO2 95%   BMI 32.23 kg/m      01/14/2022    3:57 PM 11/26/2021    9:41 AM 05/28/2021    8:59 AM  BP/Weight  Systolic BP 884 166 063  Diastolic  BP 78 74 77  Wt. (Lbs) 212 215 214.2  BMI 32.23 kg/m2 32.69 kg/m2 32.57 kg/m2    Physical Exam  Lab Results  Component Value Date   WBC 4.5 12/17/2021   HGB 17.2 12/17/2021   HCT 48.4 12/17/2021   PLT 142 (L) 12/17/2021   GLUCOSE 106 (H) 12/17/2021   CHOL 166 12/17/2021   TRIG 141 12/17/2021   HDL 41 12/17/2021   LDLCALC 100 (H) 12/17/2021   ALT 32 12/17/2021   AST 33 12/17/2021   NA 142 12/17/2021   K 4.6 12/17/2021   CL 103 12/17/2021   CREATININE 1.24 12/17/2021   BUN 16 12/17/2021   CO2 23 12/17/2021   PSA 0.46 02/07/2014   HGBA1C 4.9 03/09/2021     Assessment & Plan:   Problem List Items Addressed This Visit       Respiratory   Acute bronchitis - Primary    Treating with Augmentin and Promethazine DM.       Meds ordered this encounter  Medications   amoxicillin-clavulanate (AUGMENTIN) 875-125 MG tablet    Sig: Take 1 tablet by mouth 2 (two) times daily.    Dispense:  20 tablet  Refill:  0   promethazine-dextromethorphan (PROMETHAZINE-DM) 6.25-15 MG/5ML syrup    Sig: Take 5 mLs by mouth 4 (four) times daily as needed for cough.    Dispense:  118 mL    Refill:  0    Follow-up:  PRN  [place]

## 2022-01-14 NOTE — Assessment & Plan Note (Signed)
Treating with Augmentin and Promethazine DM.

## 2022-01-26 ENCOUNTER — Telehealth: Payer: Self-pay | Admitting: Internal Medicine

## 2022-01-26 NOTE — Telephone Encounter (Signed)
Thank you CG for the update. I am certainly happy to take over this patient's GI care. I believe he is due for colonoscopy in 2025; that recall can be transitioned to my name. If he is having any other issues at this time that we need to be aware of or that a clinic visit may be necessary, please update me as needed. Thanks. GM

## 2022-01-26 NOTE — Telephone Encounter (Signed)
Hi Dr. Rush Landmark,  Would you accept this transfer of care request?  Thanks

## 2022-01-26 NOTE — Telephone Encounter (Signed)
Hi Dr. Carlean Purl,  Patients wife called requesting a transfer of care for the patient states she is a currently patient of Dr. Rush Landmark, therefore he would like to have the same provider if possible.  Please advise on scheduling. Thanks

## 2022-01-26 NOTE — Telephone Encounter (Signed)
OK to switch to Dr. Rush Landmark

## 2022-02-07 ENCOUNTER — Other Ambulatory Visit: Payer: Self-pay | Admitting: Family Medicine

## 2022-02-15 NOTE — Telephone Encounter (Signed)
Called patient to advise left voicemail. °

## 2022-05-17 ENCOUNTER — Other Ambulatory Visit: Payer: Self-pay | Admitting: Family Medicine

## 2022-05-24 ENCOUNTER — Telehealth: Payer: Self-pay | Admitting: Family Medicine

## 2022-05-24 DIAGNOSIS — Z125 Encounter for screening for malignant neoplasm of prostate: Secondary | ICD-10-CM

## 2022-05-24 DIAGNOSIS — I10 Essential (primary) hypertension: Secondary | ICD-10-CM

## 2022-05-24 DIAGNOSIS — E782 Mixed hyperlipidemia: Secondary | ICD-10-CM

## 2022-05-24 NOTE — Telephone Encounter (Signed)
Pt has appt on Monday 05/30/22 and is wanting to know if PCP would go ahead and order PSA-pt states it has been a year since his last PSA. Please advise. Thank you

## 2022-05-25 NOTE — Telephone Encounter (Signed)
Lab orders placed and pt is aware 

## 2022-05-25 NOTE — Addendum Note (Signed)
Addended by: Vicente Males on: 05/25/2022 11:37 AM   Modules accepted: Orders

## 2022-05-27 LAB — COMPREHENSIVE METABOLIC PANEL
ALT: 34 IU/L (ref 0–44)
AST: 28 IU/L (ref 0–40)
Albumin/Globulin Ratio: 2.5 — ABNORMAL HIGH (ref 1.2–2.2)
Albumin: 4.5 g/dL (ref 3.9–4.9)
Alkaline Phosphatase: 108 IU/L (ref 44–121)
BUN/Creatinine Ratio: 15 (ref 10–24)
BUN: 19 mg/dL (ref 8–27)
Bilirubin Total: 0.9 mg/dL (ref 0.0–1.2)
CO2: 23 mmol/L (ref 20–29)
Calcium: 9.6 mg/dL (ref 8.6–10.2)
Chloride: 102 mmol/L (ref 96–106)
Creatinine, Ser: 1.24 mg/dL (ref 0.76–1.27)
Globulin, Total: 1.8 g/dL (ref 1.5–4.5)
Glucose: 105 mg/dL — ABNORMAL HIGH (ref 70–99)
Potassium: 4.7 mmol/L (ref 3.5–5.2)
Sodium: 142 mmol/L (ref 134–144)
Total Protein: 6.3 g/dL (ref 6.0–8.5)
eGFR: 64 mL/min/{1.73_m2} (ref >59)

## 2022-05-27 LAB — CBC WITH DIFFERENTIAL/PLATELET
Basophils Absolute: 0 10*3/uL (ref 0.0–0.2)
Basos: 1 %
EOS (ABSOLUTE): 0.1 10*3/uL (ref 0.0–0.4)
Eos: 2 %
Hematocrit: 47.3 % (ref 37.5–51.0)
Hemoglobin: 17 g/dL (ref 13.0–17.7)
Immature Grans (Abs): 0 10*3/uL (ref 0.0–0.1)
Immature Granulocytes: 1 %
Lymphocytes Absolute: 1.3 10*3/uL (ref 0.7–3.1)
Lymphs: 26 %
MCH: 33.3 pg — ABNORMAL HIGH (ref 26.6–33.0)
MCHC: 35.9 g/dL — ABNORMAL HIGH (ref 31.5–35.7)
MCV: 93 fL (ref 79–97)
Monocytes Absolute: 0.6 10*3/uL (ref 0.1–0.9)
Monocytes: 11 %
Neutrophils Absolute: 2.9 10*3/uL (ref 1.4–7.0)
Neutrophils: 59 %
Platelets: 133 10*3/uL — ABNORMAL LOW (ref 150–450)
RBC: 5.11 x10E6/uL (ref 4.14–5.80)
RDW: 12.2 % (ref 11.6–15.4)
WBC: 4.9 10*3/uL (ref 3.4–10.8)

## 2022-05-27 LAB — PSA: Prostate Specific Ag, Serum: 0.5 ng/mL (ref 0.0–4.0)

## 2022-05-27 LAB — LIPID PANEL
Chol/HDL Ratio: 4.4 ratio (ref 0.0–5.0)
Cholesterol, Total: 172 mg/dL (ref 100–199)
HDL: 39 mg/dL — ABNORMAL LOW (ref >39)
LDL Chol Calc (NIH): 96 mg/dL (ref 0–99)
Triglycerides: 215 mg/dL — ABNORMAL HIGH (ref 0–149)
VLDL Cholesterol Cal: 37 mg/dL (ref 5–40)

## 2022-05-30 ENCOUNTER — Ambulatory Visit: Payer: 59 | Admitting: Family Medicine

## 2022-05-30 VITALS — BP 137/82 | HR 96 | Temp 97.3°F | Ht 68.0 in | Wt 215.0 lb

## 2022-05-30 DIAGNOSIS — K219 Gastro-esophageal reflux disease without esophagitis: Secondary | ICD-10-CM

## 2022-05-30 DIAGNOSIS — E782 Mixed hyperlipidemia: Secondary | ICD-10-CM

## 2022-05-30 DIAGNOSIS — I10 Essential (primary) hypertension: Secondary | ICD-10-CM | POA: Diagnosis not present

## 2022-05-30 DIAGNOSIS — D696 Thrombocytopenia, unspecified: Secondary | ICD-10-CM

## 2022-05-30 DIAGNOSIS — F5101 Primary insomnia: Secondary | ICD-10-CM

## 2022-05-30 MED ORDER — AMITRIPTYLINE HCL 75 MG PO TABS: 75.0000 mg | ORAL_TABLET | Freq: Every day | ORAL | 3 refills | Status: DC

## 2022-05-30 MED ORDER — VALSARTAN 80 MG PO TABS: 80.0000 mg | ORAL_TABLET | Freq: Every day | ORAL | 3 refills | Status: DC

## 2022-05-30 NOTE — Assessment & Plan Note (Signed)
Stable.  Will monitor closely.

## 2022-05-30 NOTE — Assessment & Plan Note (Signed)
Stable.  Continue Nexium.

## 2022-05-30 NOTE — Progress Notes (Signed)
Subjective:  Patient ID: Cristian Davis, male    DOB: [birth year]  Age: 67 y.o. MRN: [id number]  CC: Chief Complaint  Patient presents with   Hypertension    HPI:  67 year old male with hypertension, vitiligo, GERD, insomnia, hyperlipidemia presents for follow-up.  Patient's preventative health care is up-to-date excluding COVID-vaccine.  Hypertension is well-controlled on valsartan.  Most recent lipid panel with LDL less than 100.  GERD stable on Nexium.  Insomnia stable on Elavil.  Patient states that he is feeling well.  No chest pain or shortness of breath.  No complaints or concerns at this time.  In review of patient's labs, patient had thrombocytopenia.  Mild.  Mildly elevated MCH and MCHC.  Patient Active Problem List   Diagnosis Date Noted   GERD (gastroesophageal reflux disease) 05/30/2022   Thrombocytopenia ([place]) 05/30/2022   Mixed hyperlipidemia 11/26/2021   Essential hypertension 03/26/2021   Hx of adenomatous polyp of colon 08/03/2016   Insomnia 02/18/2013   Vitiligo 02/18/2013    Social Hx   Social History   Socioeconomic History   Marital status: Married    Spouse name: Not on file   Number of children: Not on file   Years of education: Not on file   Highest education level: Not on file  Occupational History    Employer: [employer]  Tobacco Use   Smoking status: Never   Smokeless tobacco: Never  Substance and Sexual Activity   Alcohol use: Yes    Comment: occasional   Drug use: No   Sexual activity: Not on file  Other Topics Concern   Not on file  Social History Narrative   Married   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 (Lbs) 215 212 215  BMI 32.69 kg/m2 32.23 kg/m2 32.69 kg/m2    Physical Exam Vitals and nursing note reviewed.  Constitutional:      General: He is not in acute distress.    Appearance: Normal appearance.  HENT:     Head: Normocephalic and atraumatic.  Cardiovascular:     Rate and Rhythm: Normal rate and regular rhythm.  Pulmonary:     Effort: Pulmonary effort is normal.     Breath sounds: Normal breath sounds. No wheezing, rhonchi or rales.  Neurological:     Mental Status: He is alert.  Psychiatric:        Mood and Affect: Mood normal.        Behavior: Behavior normal.     Lab Results  Component Value Date   WBC 4.9 05/26/2022   HGB 17.0 05/26/2022   HCT 47.3 05/26/2022   PLT 133 (L) 05/26/2022   GLUCOSE 105 (H) 05/26/2022   CHOL 172 05/26/2022  TRIG 215 (H) 05/26/2022   HDL 39 (L) 05/26/2022   LDLCALC 96 05/26/2022   ALT 34 05/26/2022   AST 28 05/26/2022   NA 142 05/26/2022   K 4.7 05/26/2022   CL 102 05/26/2022   CREATININE 1.24 05/26/2022   BUN 19 05/26/2022   CO2 23 05/26/2022   PSA 0.46 02/07/2014   HGBA1C 4.9 03/09/2021     Assessment & Plan:   Problem List Items Addressed This Visit       Cardiovascular and Mediastinum   Essential hypertension - Primary    Stable.  Continue valsartan.      Relevant Medications   valsartan (DIOVAN) 80 MG tablet     Digestive   GERD (gastroesophageal reflux disease)    Stable.  Continue Nexium.        Hematopoietic and Hemostatic   Thrombocytopenia (HCC)    Persistent.  Also has mildly elevated MCH and MCHC.  I have reached out to hematology/oncology.  Awaiting for response back.        Other   Insomnia    Stable on Elavil.  Continue.       Relevant Medications   amitriptyline (ELAVIL) 75 MG tablet   Mixed hyperlipidemia    Stable.  Will monitor closely.      Relevant Medications   valsartan (DIOVAN) 80 MG tablet    Meds ordered this encounter  Medications   amitriptyline (ELAVIL) 75 MG tablet    Sig: Take 1 tablet (75 mg total) by mouth at bedtime.    Dispense:  90 tablet    Refill:  3   valsartan (DIOVAN) 80 MG tablet    Sig: Take 1 tablet (80 mg total) by mouth daily.    Dispense:  90 tablet    Refill:  3    Follow-up:  Return in about 6 months (around 11/28/2022).  [place]

## 2022-05-30 NOTE — Patient Instructions (Signed)
Continue your meds.  Follow up in 6 months.

## 2022-05-30 NOTE — Assessment & Plan Note (Signed)
Stable.  Continue valsartan.

## 2022-05-30 NOTE — Assessment & Plan Note (Addendum)
Stable on Elavil.  Continue.

## 2022-05-30 NOTE — Assessment & Plan Note (Signed)
Persistent.  Also has mildly elevated MCH and MCHC.  I have reached out to hematology/oncology.  Awaiting for response back.

## 2022-10-10 DIAGNOSIS — M2242 Chondromalacia patellae, left knee: Secondary | ICD-10-CM | POA: Insufficient documentation

## 2022-10-10 DIAGNOSIS — M2241 Chondromalacia patellae, right knee: Secondary | ICD-10-CM | POA: Insufficient documentation

## 2022-10-22 ENCOUNTER — Ambulatory Visit
Admission: EM | Admit: 2022-10-22 | Discharge: 2022-10-22 | Disposition: A | Discharge status: Home / Self Care | Payer: 59 | Attending: Nurse Practitioner | Admitting: Nurse Practitioner

## 2022-10-22 DIAGNOSIS — H6121 Impacted cerumen, right ear: Secondary | ICD-10-CM

## 2022-10-22 NOTE — ED Triage Notes (Signed)
Pt c/o right ear fullness, x 2 days pt states he has done everything he can thing of to get it unclogged.

## 2022-10-22 NOTE — ED Provider Notes (Signed)
[facility]    CSN: [id number] Arrival date & time: 10/22/22  9562      History   Chief Complaint No chief complaint on file.   HPI Cristian Davis is a 67 y.o. male.   The history is provided by the patient.   The patient presents for complaints of the right ear being "clogged" for the past several days.  Patient states that he has had wax buildup in the past.  He denies fever, chills, headache, ear drainage, or upper respiratory symptoms.  Patient reports small amount of decreased hearing, but states "I can still hear out of it".  States he has tried several remedies at home with no relief of symptoms.  Past Medical History:  Diagnosis Date   Anxiety    Hx of adenomatous polyp of colon 08/03/2016   Impaired fasting glucose    Insomnia    Vitiligo     Patient Active Problem List   Diagnosis Date Noted   GERD (gastroesophageal reflux disease) 05/30/2022   Thrombocytopenia (HCC) 05/30/2022   Mixed hyperlipidemia 11/26/2021   Essential hypertension 03/26/2021   Hx of adenomatous polyp of colon 08/03/2016   Insomnia 02/18/2013   Vitiligo 02/18/2013    Past Surgical History:  Procedure Laterality Date   COLONOSCOPY  2007   repeated 2018   ESOPHAGOGASTRODUODENOSCOPY  03/31/2017   lasic eye surgery Bilateral    WRIST SURGERY  88-89   cysts removed from wrist       Home Medications    Prior to Admission medications   Medication Sig Start Date End Date Taking? Authorizing Provider  amitriptyline (ELAVIL) 75 MG tablet Take 1 tablet (75 mg total) by mouth at bedtime. 05/30/22   Tommie Sams, DO  esomeprazole (NEXIUM) 20 MG capsule Take 20 mg by mouth daily at 12 noon.    [provider]  valsartan (DIOVAN) 80 MG tablet Take 1 tablet (80 mg total) by mouth daily. 05/30/22   Tommie Sams, DO    Family History Family History  Problem Relation Age of Onset   Heart attack Father    Cancer Father    Heart attack Sister    Colon cancer Neg Hx     Esophageal cancer Neg Hx    Rectal cancer Neg Hx    Stomach cancer Neg Hx     Social History Social History   Tobacco Use   Smoking status: Never   Smokeless tobacco: Never  Substance Use Topics   Alcohol use: Yes    Comment: occasional   Drug use: No     Allergies   Patient has no known allergies.   Review of Systems Review of Systems Per HPI  Physical Exam Triage Vital Signs ED Triage Vitals  Enc Vitals Group     BP 10/22/22 0940 117/76     Pulse Rate 10/22/22 0940 (!) 114     Resp 10/22/22 0940 17     Temp 10/22/22 0940 98 F (36.7 C)     Temp Source 10/22/22 0940 Oral     SpO2 10/22/22 0940 95 %     Weight --      Height --      Head Circumference --      Peak Flow --      Pain Score 10/22/22 0941 0     Pain Loc --      Pain Edu? --      Excl. in GC? --    No data  found.  Updated Vital Signs BP 117/76 (BP Location: Right Arm)   Pulse (!) 114   Temp 98 F (36.7 C) (Oral)   Resp 17   SpO2 95%   Visual Acuity Right Eye Distance:   Left Eye Distance:   Bilateral Distance:    Right Eye Near:   Left Eye Near:    Bilateral Near:     Physical Exam Vitals and nursing note reviewed.  Constitutional:      General: He is not in acute distress.    Appearance: Normal appearance.  HENT:     Head: Normocephalic.     Right Ear: There is impacted cerumen.     Left Ear: Tympanic membrane, ear canal and external ear normal.     Ears:     Comments: Ear irrigation performed with complete removal of cerumen impaction. RIght TM without bulging or erythema.    Nose: Nose normal.     Mouth/Throat:     Mouth: Mucous membranes are moist.  Eyes:     Extraocular Movements: Extraocular movements intact.     Pupils: Pupils are equal, round, and reactive to light.  Pulmonary:     Effort: Pulmonary effort is normal.  Musculoskeletal:     Cervical back: Normal range of motion.  Skin:    General: Skin is warm and dry.  Neurological:     General: No focal  deficit present.     Mental Status: He is alert.  Psychiatric:        Mood and Affect: Mood normal.        Behavior: Behavior normal.      UC Treatments / Results  Labs (all labs ordered are listed, but only abnormal results are displayed) Labs Reviewed - No data to display  EKG   Radiology No results found.  Procedures Procedures (including critical care time)  Medications Ordered in UC Medications - No data to display  Initial Impression / Assessment and Plan / UC Course  I have reviewed the triage vital signs and the nursing notes.  Pertinent labs & imaging results that were available during my care of the patient were reviewed by me and considered in my medical decision making (see chart for details).  The patient is well-appearing, he is in no acute distress, vital signs are stable.  Patient presents with cerumen impaction of the right ear.  Ear irrigation was performed with complete removal of the cerumen impaction.  Right tympanic membrane was without erythema or bulging.  Patient tolerated procedure well.  Supportive care recommendations were provided and discussed with the patient to include use of over-the-counter Debrox earwax softener to prevent hardening of wax, and avoiding sticking anything inside of the ears while symptoms persist.  Patient was in agreement with this plan of care and verbalized understanding.  All questions were answered.  Patient stable for discharge.  Final Clinical Impressions(s) / UC Diagnoses   Final diagnoses:  Impacted cerumen, right ear     Discharge Instructions      The wax buildup in your ear was able to be removed today. May use over-the-counter ear irrigation kits as needed for wax buildup. Do not stick or insert anything inside of the ear in the future as this will only cause the wax to build up more. Follow-up as needed.     ED Prescriptions   None    PDMP not reviewed this encounter.   Abran Cantor, NP 10/22/22 1017

## 2022-10-22 NOTE — Discharge Instructions (Addendum)
The wax buildup in your ear was able to be removed today. May use over-the-counter ear irrigation kits as needed for wax buildup. Do not stick or insert anything inside of the ear in the future as this will only cause the wax to build up more. Follow-up as needed.

## 2022-12-07 ENCOUNTER — Ambulatory Visit: Payer: 59 | Admitting: Family Medicine

## 2022-12-14 ENCOUNTER — Ambulatory Visit: Payer: 59 | Admitting: Family Medicine

## 2022-12-14 ENCOUNTER — Ambulatory Visit (HOSPITAL_COMMUNITY)
Admission: RE | Admit: 2022-12-14 | Discharge: 2022-12-14 | Disposition: A | Discharge status: Home / Self Care | Payer: 59 | Source: Ambulatory Visit | Attending: Family Medicine | Admitting: Family Medicine

## 2022-12-14 VITALS — BP 122/70 | HR 56 | Temp 98.1°F | Ht 68.0 in | Wt 213.2 lb

## 2022-12-14 DIAGNOSIS — M546 Pain in thoracic spine: Secondary | ICD-10-CM

## 2022-12-14 DIAGNOSIS — R0609 Other forms of dyspnea: Secondary | ICD-10-CM

## 2022-12-14 DIAGNOSIS — I10 Essential (primary) hypertension: Secondary | ICD-10-CM

## 2022-12-14 DIAGNOSIS — E782 Mixed hyperlipidemia: Secondary | ICD-10-CM

## 2022-12-14 DIAGNOSIS — K219 Gastro-esophageal reflux disease without esophagitis: Secondary | ICD-10-CM

## 2022-12-14 MED ORDER — MELOXICAM 15 MG PO TABS: 15.0000 mg | ORAL_TABLET | Freq: Every day | ORAL | 0 refills | Status: DC | PRN

## 2022-12-14 NOTE — Patient Instructions (Signed)
Xray at the hospital.  Labs today.  Medication as needed.  Referral placed.  Follow up in ~ 1 month

## 2022-12-15 ENCOUNTER — Other Ambulatory Visit: Payer: Self-pay

## 2022-12-15 DIAGNOSIS — M546 Pain in thoracic spine: Secondary | ICD-10-CM | POA: Insufficient documentation

## 2022-12-15 DIAGNOSIS — R0609 Other forms of dyspnea: Secondary | ICD-10-CM | POA: Insufficient documentation

## 2022-12-15 NOTE — Progress Notes (Signed)
Subjective:  Patient ID: Cristian Davis, male    DOB: 1955-07-25  Age: 67 y.o. MRN: [id number]  CC:  Follow up   HPI:  67 year old male with hypertension, GERD, vitiligo, thrombocytopenia, insomnia, and hyperlipidemia presents for follow-up.  Hypertension is stable on valsartan.  Patient needs labs today.  GERD stable on Nexium.  Patient reports that for the past 2 weeks he has had left-sided upper back pain.  No known inciting factor.  He reports that he has used ibuprofen with improvement but no resolution.  Additionally, patient also reports that he has recently noticed shortness of breath with exertion.  He states that he gets winded when he goes up a couple of flights of steps.  He also notes that he recently took a trip with his daughter and went fishing and got winded after walking approximately half a mile.  Denies chest pain.  He states that his wife is concerned.  Patient Active Problem List   Diagnosis Date Noted   DOE (dyspnea on exertion) 12/15/2022   Left-sided thoracic back pain 12/15/2022   GERD (gastroesophageal reflux disease) 05/30/2022   Thrombocytopenia (HCC) 05/30/2022   Mixed hyperlipidemia 11/26/2021   Essential hypertension 03/26/2021   Hx of adenomatous polyp of colon 08/03/2016   Insomnia 02/18/2013   Vitiligo 02/18/2013    Social Hx   Social History   Socioeconomic History   Marital status: Married    Spouse name: Not on file   Number of children: Not on file   Years of education: Not on file   Highest education level: Not on file  Occupational History    Employer: [employer]  Tobacco Use   Smoking status: Never   Smokeless tobacco: Never  Substance and Sexual Activity   Alcohol use: Yes    Comment: occasional   Drug use: No   Sexual activity: Not on file  Other Topics Concern   Not on file  Social History Narrative   Married   Works at [employer]    Never smoker, occ EtOH, no drugs   Social Determinants of Manufacturing engineer Strain: Not on file  Food Insecurity: Not on file  Transportation Needs: Not on file  Physical Activity: Not on file  Stress: Not on file  Social Connections: Not on file    Review of Systems Per HPI  Objective:  BP 122/70   Pulse (!) 56   Temp 98.1 F (36.7 C)   Ht 5\' 8"  (1.727 m)   Wt 213 lb 3.2 oz (96.7 kg)   SpO2 99%   BMI 32.42 kg/m      12/14/2022   10:27 AM 10/22/2022    9:40 AM 05/30/2022    8:32 AM  BP/Weight  Systolic BP 122 117 137  Diastolic BP 70 76 82  Wt. (Lbs) 213.2  215  BMI 32.42 kg/m2  32.69 kg/m2    Physical Exam Vitals reviewed.  Constitutional:      General: He is not in acute distress.    Appearance: Normal appearance.  HENT:     Head: Normocephalic and atraumatic.  Eyes:     General:        Right eye: No discharge.        Left eye: No discharge.     Conjunctiva/sclera: Conjunctivae normal.  Cardiovascular:     Rate and Rhythm: Normal rate and regular rhythm.  Pulmonary:     Effort: Pulmonary effort is normal.     Breath sounds:  Normal breath sounds. No wheezing, rhonchi or rales.  Neurological:     Mental Status: He is alert.  Psychiatric:        Mood and Affect: Mood normal.        Behavior: Behavior normal.     Lab Results  Component Value Date   WBC 4.9 05/26/2022   HGB 17.0 05/26/2022   HCT 47.3 05/26/2022   PLT 133 (L) 05/26/2022   GLUCOSE 105 (H) 05/26/2022   CHOL 172 05/26/2022   TRIG 215 (H) 05/26/2022   HDL 39 (L) 05/26/2022   LDLCALC 96 05/26/2022   ALT 34 05/26/2022   AST 28 05/26/2022   NA 142 05/26/2022   K 4.7 05/26/2022   CL 102 05/26/2022   CREATININE 1.24 05/26/2022   BUN 19 05/26/2022   CO2 23 05/26/2022   PSA 0.46 02/07/2014   HGBA1C 4.9 03/09/2021   EKG: Interpretation -normal sinus rhythm with a rate of 68.  Normal axis.  Normal intervals.  No ST or T wave changes.  Normal EKG.  Assessment & Plan:   Problem List Items Addressed This Visit       Cardiovascular and  Mediastinum   Essential hypertension - Primary    Stable.  Continue valsartan.  Labs today.      Relevant Orders   CMP14+EGFR     Digestive   GERD (gastroesophageal reflux disease)    Stable on Nexium.        Other   Mixed hyperlipidemia   Relevant Orders   Lipid panel   DOE (dyspnea on exertion)    EKG normal today.  Labs today.  Referring to cardiology.  May need stress test.        Relevant Orders   EKG 12-Lead (Completed)   CBC   Brain natriuretic peptide   Ambulatory referral to Cardiology   Left-sided thoracic back pain    X-ray for further evaluation. Meloxicam as needed.      Relevant Medications   meloxicam (MOBIC) 15 MG tablet   Other Relevant Orders   DG Thoracic Spine W/Swimmers (Completed)    Meds ordered this encounter  Medications   meloxicam (MOBIC) 15 MG tablet    Sig: Take 1 tablet (15 mg total) by mouth daily as needed for pain.    Dispense:  14 tablet    Refill:  0    Follow-up:  Return in about 1 month (around 01/14/2023).  Everlene Other DO [facility]

## 2022-12-15 NOTE — Assessment & Plan Note (Signed)
Stable on Nexium.

## 2022-12-15 NOTE — Assessment & Plan Note (Signed)
Stable.  Continue valsartan.  Labs today.

## 2022-12-15 NOTE — Assessment & Plan Note (Signed)
EKG normal today.  Labs today.  Referring to cardiology.  May need stress test.

## 2022-12-15 NOTE — Assessment & Plan Note (Addendum)
X-ray for further evaluation. Meloxicam as needed.

## 2022-12-16 ENCOUNTER — Ambulatory Visit (HOSPITAL_COMMUNITY)
Admission: RE | Admit: 2022-12-16 | Discharge: 2022-12-16 | Disposition: A | Discharge status: Home / Self Care | Payer: 59 | Source: Ambulatory Visit | Attending: Family Medicine | Admitting: Family Medicine

## 2022-12-16 ENCOUNTER — Encounter (HOSPITAL_COMMUNITY): Payer: Self-pay

## 2022-12-16 DIAGNOSIS — M546 Pain in thoracic spine: Secondary | ICD-10-CM | POA: Insufficient documentation

## 2022-12-17 LAB — CBC
Hematocrit: 45.3 % (ref 37.5–51.0)
Hemoglobin: 16.1 g/dL (ref 13.0–17.7)
MCH: 33.2 pg — ABNORMAL HIGH (ref 26.6–33.0)
MCHC: 35.5 g/dL (ref 31.5–35.7)
MCV: 93 fL (ref 79–97)
Platelets: 125 10*3/uL — ABNORMAL LOW (ref 150–450)
RBC: 4.85 x10E6/uL (ref 4.14–5.80)
RDW: 13 % (ref 11.6–15.4)
WBC: 3.7 10*3/uL (ref 3.4–10.8)

## 2022-12-17 LAB — LIPID PANEL
Chol/HDL Ratio: 4.3 ratio (ref 0.0–5.0)
Cholesterol, Total: 168 mg/dL (ref 100–199)
HDL: 39 mg/dL — ABNORMAL LOW (ref >39)
LDL Chol Calc (NIH): 98 mg/dL (ref 0–99)
Triglycerides: 176 mg/dL — ABNORMAL HIGH (ref 0–149)
VLDL Cholesterol Cal: 31 mg/dL (ref 5–40)

## 2022-12-17 LAB — CMP14+EGFR
ALT: 37 IU/L (ref 0–44)
AST: 37 IU/L (ref 0–40)
Albumin: 4.5 g/dL (ref 3.9–4.9)
Alkaline Phosphatase: 105 IU/L (ref 44–121)
BUN/Creatinine Ratio: 14 (ref 10–24)
BUN: 19 mg/dL (ref 8–27)
Bilirubin Total: 0.9 mg/dL (ref 0.0–1.2)
CO2: 25 mmol/L (ref 20–29)
Calcium: 9.4 mg/dL (ref 8.6–10.2)
Chloride: 104 mmol/L (ref 96–106)
Creatinine, Ser: 1.33 mg/dL — ABNORMAL HIGH (ref 0.76–1.27)
Globulin, Total: 1.7 g/dL (ref 1.5–4.5)
Glucose: 104 mg/dL — ABNORMAL HIGH (ref 70–99)
Potassium: 4.5 mmol/L (ref 3.5–5.2)
Sodium: 142 mmol/L (ref 134–144)
Total Protein: 6.2 g/dL (ref 6.0–8.5)
eGFR: 59 mL/min/{1.73_m2} — ABNORMAL LOW (ref >59)

## 2022-12-17 LAB — BRAIN NATRIURETIC PEPTIDE: BNP: 7.1 pg/mL (ref 0.0–100.0)

## 2022-12-20 ENCOUNTER — Encounter: Payer: Self-pay | Admitting: Family Medicine

## 2022-12-21 ENCOUNTER — Other Ambulatory Visit: Payer: Self-pay | Admitting: Family Medicine

## 2022-12-21 ENCOUNTER — Other Ambulatory Visit: Payer: Self-pay

## 2022-12-21 DIAGNOSIS — R7989 Other specified abnormal findings of blood chemistry: Secondary | ICD-10-CM

## 2022-12-21 DIAGNOSIS — R9389 Abnormal findings on diagnostic imaging of other specified body structures: Secondary | ICD-10-CM

## 2022-12-21 MED ORDER — BACLOFEN 10 MG PO TABS: 5.0000 mg | ORAL_TABLET | Freq: Two times a day (BID) | ORAL | 0 refills | Status: DC

## 2023-01-16 ENCOUNTER — Ambulatory Visit: Payer: 59 | Admitting: Family Medicine

## 2023-01-16 VITALS — BP 116/65 | HR 79 | Temp 97.3°F | Ht 68.0 in | Wt 214.0 lb

## 2023-01-16 DIAGNOSIS — I10 Essential (primary) hypertension: Secondary | ICD-10-CM

## 2023-01-16 DIAGNOSIS — R0609 Other forms of dyspnea: Secondary | ICD-10-CM | POA: Diagnosis not present

## 2023-01-16 DIAGNOSIS — I2510 Atherosclerotic heart disease of native coronary artery without angina pectoris: Secondary | ICD-10-CM

## 2023-01-16 DIAGNOSIS — Z23 Encounter for immunization: Secondary | ICD-10-CM

## 2023-01-16 DIAGNOSIS — I2584 Coronary atherosclerosis due to calcified coronary lesion: Secondary | ICD-10-CM

## 2023-01-16 NOTE — Progress Notes (Signed)
Subjective:  Patient ID: Cristian Davis, male    DOB: 12-Jul-1955  Age: 67 y.o. MRN: [id number]  CC: Chief Complaint  Patient presents with   DOE 1 month follow up    HPI:  67 year old male presents for follow-up regarding dyspnea on exertion.  No improvement.  Still having dyspnea with physical activity.  We reviewed his recent CT findings.  He has upcoming appointment with pulmonology as well as cardiology.  Denies chest pain.  No other reported symptoms.  No other complaints.   Patient Active Problem List   Diagnosis Date Noted   Coronary artery calcification 01/16/2023   DOE (dyspnea on exertion) 12/15/2022   Left-sided thoracic back pain 12/15/2022   GERD (gastroesophageal reflux disease) 05/30/2022   Thrombocytopenia (HCC) 05/30/2022   Mixed hyperlipidemia 11/26/2021   Essential hypertension 03/26/2021   Hx of adenomatous polyp of colon 08/03/2016   Insomnia 02/18/2013   Vitiligo 02/18/2013    Social Hx   Social History   Socioeconomic History   Marital status: Married    Spouse name: Not on file   Number of children: Not on file   Years of education: Not on file   Highest education level: Not on file  Occupational History    Employer: [employer]  Tobacco Use   Smoking status: Never   Smokeless tobacco: Never  Substance and Sexual Activity   Alcohol use: Yes    Comment: occasional   Drug use: No   Sexual activity: Not on file  Other Topics Concern   Not on file  Social History Narrative   Married   Works at [employer]    Never smoker, occ EtOH, no drugs   Social Determinants of Corporate investment banker Strain: Not on file  Food Insecurity: Not on file  Transportation Needs: Not on file  Physical Activity: Not on file  Stress: Not on file  Social Connections: Not on file    Review of Systems Per HPI  Objective:  BP 116/65   Pulse 79   Temp (!) 97.3 F (36.3 C)   Ht 5\' 8"  (1.727 m)   Wt 214 lb (97.1 kg)   SpO2 98%   BMI  32.54 kg/m      01/16/2023    1:13 PM 12/14/2022   10:27 AM 10/22/2022    9:40 AM  BP/Weight  Systolic BP 116 122 117  Diastolic BP 65 70 76  Wt. (Lbs) 214 213.2   BMI 32.54 kg/m2 32.42 kg/m2     Physical Exam Vitals and nursing note reviewed.  Constitutional:      General: He is not in acute distress.    Appearance: Normal appearance.  HENT:     Head: Normocephalic and atraumatic.  Eyes:     General:        Right eye: No discharge.        Left eye: No discharge.     Conjunctiva/sclera: Conjunctivae normal.  Cardiovascular:     Rate and Rhythm: Normal rate and regular rhythm.  Pulmonary:     Effort: Pulmonary effort is normal.     Breath sounds: Normal breath sounds. No wheezing, rhonchi or rales.  Neurological:     Mental Status: He is alert.  Psychiatric:        Mood and Affect: Mood normal.        Behavior: Behavior normal.     Lab Results  Component Value Date   WBC 3.7 12/16/2022   HGB 16.1 12/16/2022  HCT 45.3 12/16/2022   PLT 125 (L) 12/16/2022   GLUCOSE 104 (H) 12/16/2022   CHOL 168 12/16/2022   TRIG 176 (H) 12/16/2022   HDL 39 (L) 12/16/2022   LDLCALC 98 12/16/2022   ALT 37 12/16/2022   AST 37 12/16/2022   NA 142 12/16/2022   K 4.5 12/16/2022   CL 104 12/16/2022   CREATININE 1.33 (H) 12/16/2022   BUN 19 12/16/2022   CO2 25 12/16/2022   PSA 0.46 02/07/2014   HGBA1C 4.9 03/09/2021     Assessment & Plan:   Problem List Items Addressed This Visit       Cardiovascular and Mediastinum   Essential hypertension    BP stable.  Continue valsartan.      Coronary artery calcification     Other   DOE (dyspnea on exertion) - Primary    Has upcoming appointment with pulmonology.  Concern for cardiac source given age, comorbidity, and coronary calcification on CT.  Advised normal activities.  No vigorous activity.  Awaiting evaluations by pulmonary and cardiology.  Recommend stress test or CT coronary      Other Visit Diagnoses      Immunization due       Relevant Orders   Flu Vaccine Trivalent High Dose (Fluad) (Completed)       Follow-up:  Return in about 3 months (around 04/17/2023).  Everlene Other DO [facility]

## 2023-01-16 NOTE — Assessment & Plan Note (Signed)
Has upcoming appointment with pulmonology.  Concern for cardiac source given age, comorbidity, and coronary calcification on CT.  Advised normal activities.  No vigorous activity.  Awaiting evaluations by pulmonary and cardiology.  Recommend stress test or CT coronary

## 2023-01-16 NOTE — Patient Instructions (Signed)
No strenuous physical activity.  Follow up in 3 months.

## 2023-01-16 NOTE — Assessment & Plan Note (Signed)
BP stable.  Continue valsartan.

## 2023-01-20 ENCOUNTER — Ambulatory Visit: Payer: 59 | Attending: Internal Medicine | Admitting: Internal Medicine

## 2023-01-20 ENCOUNTER — Encounter: Payer: Self-pay | Admitting: Internal Medicine

## 2023-01-20 VITALS — BP 104/68 | HR 78 | Ht 69.0 in | Wt 212.2 lb

## 2023-01-20 DIAGNOSIS — I2510 Atherosclerotic heart disease of native coronary artery without angina pectoris: Secondary | ICD-10-CM | POA: Diagnosis not present

## 2023-01-20 DIAGNOSIS — E782 Mixed hyperlipidemia: Secondary | ICD-10-CM

## 2023-01-20 DIAGNOSIS — R0609 Other forms of dyspnea: Secondary | ICD-10-CM | POA: Diagnosis not present

## 2023-01-20 DIAGNOSIS — Z01812 Encounter for preprocedural laboratory examination: Secondary | ICD-10-CM

## 2023-01-20 DIAGNOSIS — Z8249 Family history of ischemic heart disease and other diseases of the circulatory system: Secondary | ICD-10-CM

## 2023-01-20 MED ORDER — METOPROLOL TARTRATE 50 MG PO TABS: 50.0000 mg | ORAL_TABLET | ORAL | 0 refills | Status: DC

## 2023-01-20 NOTE — Patient Instructions (Addendum)
Medication Instructions:  NO CHANGES  Take metoprolol tartrate 50mg  -- two hours prior to CT test  *If you need a refill on your cardiac medications before your next appointment, please call your pharmacy*   Lab Work: BMET and LPa today   If you have labs (blood work) drawn today and your tests are completely normal, you will receive your results only by: MyChart Message (if you have MyChart) OR A paper copy in the mail If you have any lab test that is abnormal or we need to change your treatment, we will call you to review the results.   Testing/Procedures: Coronary CTA at [facility]. You'll get a call to schedule this once approved with insurance.   Your physician has requested that you have an echocardiogram. Echocardiography is a painless test that uses sound waves to create images of your heart. It provides your doctor with information about the size and shape of your heart and how well your heart's chambers and valves are working. This procedure takes approximately one hour. There are no restrictions for this procedure. Please do NOT wear cologne, perfume, aftershave, or lotions (deodorant is allowed). Please arrive 15 minutes prior to your appointment time.    Follow-Up: At [facility], you and your health needs are our priority.  As part of our continuing mission to provide you with exceptional heart care, we have created designated Provider Care Teams.  These Care Teams include your primary Cardiologist (physician) and Advanced Practice Providers (APPs -  Physician Assistants and Nurse Practitioners) who all work together to provide you with the care you need, when you need it.  We recommend signing up for the patient portal called "MyChart".  Sign up information is provided on this After Visit Summary.  MyChart is used to connect with patients for Virtual Visits (Telemedicine).  Patients are able to view lab/test results, encounter notes, upcoming appointments,  etc.  Non-urgent messages can be sent to your provider as well.   To learn more about what you can do with MyChart, go to [email or web address].    Your next appointment:    6-8 weeks (after testing) with Dr. Rennis Golden or NP/PA Other Instructions    Your cardiac CT will be scheduled at one of the below locations:   [facility] [address] [place], [place] [address] [phone]  OR  [facility] [address] [place], [place] [address] [phone]  OR   [facility] [address] [place], [place] [address] [phone]  If scheduled at [facility], please arrive at the [facility] (Entrance C2) of [facility] 30 minutes prior to test start time. You can use the FREE valet parking offered at entrance C (encouraged to control the heart rate for the test)  Proceed to the [facility] Radiology Department (first floor) to check-in and test prep.  All radiology patients and guests should use entrance C2 at [facility], accessed from [facility], even though the hospital's physical address listed is [address]    If scheduled at [facility] or [facility], please arrive 15 mins early for check-in and test prep.  There is spacious parking and easy access to the radiology department from the [facility] entrance. Please enter here and check-in with the desk attendant.   Please follow these instructions carefully (unless otherwise directed):  An IV will be  required for this test and Nitroglycerin will be given.  Hold all erectile dysfunction medications at least 3 days (72 hrs) prior to test. (Ie viagra, cialis, sildenafil, tadalafil, etc)   On the Night Before the Test: Be sure to Drink plenty of water. Do not consume any caffeinated/decaffeinated beverages or  chocolate 12 hours prior to your test. Do not take any antihistamines 12 hours prior to your test.  On the Day of the Test: Drink plenty of water until 1 hour prior to the test. Do not eat any food 1 hour prior to test. You may take your regular medications prior to the test.  Take metoprolol (Lopressor) two hours prior to test.  After the Test: Drink plenty of water. After receiving IV contrast, you may experience a mild flushed feeling. This is normal. On occasion, you may experience a mild rash up to 24 hours after the test. This is not dangerous. If this occurs, you can take Benadryl 25 mg and increase your fluid intake. If you experience trouble breathing, this can be serious. If it is severe call 911 IMMEDIATELY. If it is mild, please call our office. If you take any of these medications: Glipizide/Metformin, Avandament, Glucavance, please do not take 48 hours after completing test unless otherwise instructed.  We will call to schedule your test 2-4 weeks out understanding that some insurance companies will need an authorization prior to the service being performed.   For more information and frequently asked questions, please visit our website : [email or web address]  For non-scheduling related questions, please contact the cardiac imaging nurse navigator should you have any questions/concerns: Cardiac Imaging Nurse Navigators Direct Office Dial: [phone]   For scheduling needs, including cancellations and rescheduling, please call [place], [phone].

## 2023-01-20 NOTE — Progress Notes (Signed)
OFFICE CONSULT NOTE  Chief Complaint:  DOE  Primary Care Physician: Tommie Sams, DO  HPI:  Cristian Davis is a 67 y.o. male who is being seen today for the evaluation of dyspnea on exertion at the request of Tommie Sams, DO.  This is a pleasant 67 year old male kindly referred for evaluation management of dyspnea on exertion.  He reports over the past several months he has had worsening dyspnea with exertion such as exercise including walking up hills or walking his dog.  He is generally fairly sedentary and works from a computer at home.  The symptoms however were not present last year.  He reports no significant weight gain, recent infection, other complicating issues or medications.  He does however have a family history of heart disease including his father, uncle and paternal grandfather all of which who had died of heart attacks.  He had recent imaging including a CT scan of the chest that showed aortic atherosclerosis and coronary artery calcification although it was not quantified.  He denies any anginal symptoms.  He has not had any syncope or palpitations.  His only other risk factor is hypertension however he is well-controlled on valsartan with blood pressure 104/68 today.  He did have lipid testing in August 2024 showing total cholesterol 168, HDL 39, triglycerides 176 and LDL 98.  PMHx:  Past Medical History:  Diagnosis Date   Anxiety    Hx of adenomatous polyp of colon 08/03/2016   Impaired fasting glucose    Insomnia    Vitiligo     Past Surgical History:  Procedure Laterality Date   COLONOSCOPY  2007   repeated 2018   ESOPHAGOGASTRODUODENOSCOPY  03/31/2017   lasic eye surgery Bilateral    WRIST SURGERY  88-89   cysts removed from wrist    FAMHx:  Family History  Problem Relation Age of Onset   Heart attack Father    Cancer Father    Heart attack Sister    Colon cancer Neg Hx    Esophageal cancer Neg Hx    Rectal cancer Neg Hx    Stomach cancer Neg  Hx     SOCHx:   reports that he has never smoked. He has never used smokeless tobacco. He reports current alcohol use. He reports that he does not use drugs.  ALLERGIES:  No Known Allergies  ROS: Pertinent items noted in HPI and remainder of comprehensive ROS otherwise negative.  HOME MEDS: Current Outpatient Medications on File Prior to Visit  Medication Sig Dispense Refill   amitriptyline (ELAVIL) 75 MG tablet Take 1 tablet (75 mg total) by mouth at bedtime. 90 tablet 3   esomeprazole (NEXIUM) 20 MG capsule Take 20 mg by mouth daily at 12 noon.     valsartan (DIOVAN) 80 MG tablet Take 1 tablet (80 mg total) by mouth daily. 90 tablet 3   baclofen (LIORESAL) 10 MG tablet Take 0.5-1 tablets (5-10 mg total) by mouth 2 (two) times daily. (Patient not taking: Reported on 01/20/2023) 30 each 0   meloxicam (MOBIC) 15 MG tablet Take 1 tablet (15 mg total) by mouth daily as needed for pain. (Patient not taking: Reported on 01/20/2023) 14 tablet 0   No current facility-administered medications on file prior to visit.    LABS/IMAGING: No results found for this or any previous visit (from the past 48 hour(s)). No results found.  LIPID PANEL:    Component Value Date/Time   CHOL 168 12/16/2022 0949  TRIG 176 (H) 12/16/2022 0949   HDL 39 (L) 12/16/2022 0949   CHOLHDL 4.3 12/16/2022 0949   CHOLHDL 3.7 02/07/2014 0704   VLDL 22 02/07/2014 0704   LDLCALC 98 12/16/2022 0949    WEIGHTS: Wt Readings from Last 3 Encounters:  01/20/23 212 lb 3.2 oz (96.3 kg)  01/16/23 214 lb (97.1 kg)  12/14/22 213 lb 3.2 oz (96.7 kg)    VITALS: BP 104/68   Pulse 78   Ht 5\' 9"  (1.753 m)   Wt 212 lb 3.2 oz (96.3 kg)   SpO2 97%   BMI 31.34 kg/m   EXAM: General appearance: alert, no distress, and mildly obese Neck: no carotid bruit, no JVD, and thyroid not enlarged, symmetric, no tenderness/mass/nodules Lungs: clear to auscultation bilaterally Heart: regular rate and rhythm, S1, S2 normal, no  murmur, click, rub or gallop Abdomen: soft, non-tender; bowel sounds normal; no masses,  no organomegaly Extremities: extremities normal, atraumatic, no cyanosis or edema Pulses: 2+ and symmetric Skin: Pale, notable vitiligo Neurologic: Grossly normal Psych: Pleasant  EKG: EKG from 12/14/2022 reviewed-normal sinus rhythm at 68  ASSESSMENT: Progressive dyspnea on exertion Aortic atherosclerosis and coronary artery calcification seen on CT Strong family history of coronary artery disease in his father, uncle and paternal grandfather Hypertension  PLAN: 1.   Mr. Colter has had progressive dyspnea on exertion although is fairly sedentary however symptoms are worse than they were last year.  He was noted to have some aortic atherosclerosis and coronary artery calcification.  He is LDL is less than 100 and is not on a statin.  His blood pressure is well-controlled.  This could possibly be coronary artery disease with dyspnea as his anginal equivalent.  Would recommend CT coronary angiography to further evaluate this.  Will also get an echocardiogram.  He will likely need 50 mg metoprolol the day prior to the procedure to lower heart rate.  Will get metabolic profile to assess his renal function and I would assess an LP(a) given the strong family history of heart disease.  Plan follow-up with me afterwards.  He does have pulmonary evaluation next week.  Thanks again for the kind referral.  Chrystie Nose, MD, [facility]  [place]  [facility]  Medical Director of the Advanced Lipid Disorders &  Cardiovascular Risk Reduction Clinic Diplomate of the American Board of Clinical Lipidology Attending Cardiologist  Direct Dial: [phone]  Fax: [phone]  Website:  [email or web address] 01/20/2023, 8:57 AM

## 2023-01-22 NOTE — Progress Notes (Unsigned)
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  referred to pulmonary clinic in [place]  01/23/2023 by Everlene Other  for doe while in [place] x 5,000 ft assoc with mid back pain    Had covid mild case with just HA around 2022                                                    History of Present Illness  01/23/2023  Pulmonary/ 1st office eval/ [facility] / [facility]  Chief Complaint  Patient presents with   Establish Care    Abn CT scan  Dyspnea:  MMRC1 = can walk nl pace, flat grade, can't hurry or go uphills or steps s sob  dog walking is about the same as before his trip to [place]  Cough: min dry cough  x 6 months  Sleep: ok flat bed one pillow  SABA use: none  02: none     ILD REVIEW OF SYSTEMS No exposure to asbestos, silica or other organic allergens No birds or chickens No water damage or mold exposure in home No hot tub at home No arthralgias or myalgias Denies skin rash or lesions Denies nail changes or finger splitting Denies Raynaud's Denies dry eyes  No h/o chemo/XRT/amiodarone/macrodantin/MTX     No obvious day to day or daytime pattern/variability or assoc excess/ purulent sputum or mucus plugs or hemoptysis or cp or chest tightness, subjective wheeze or overt sinus or hb symptoms.    Also denies any obvious fluctuation of symptoms with weather or environmental changes or other aggravating or alleviating factors except as outlined above   No unusual exposure hx or h/o childhood pna/ asthma or knowledge of premature birth.  Current Allergies, Complete Past Medical History, Past Surgical History, Family History, and Social History were reviewed in [employer] record.  ROS  The following are not active complaints unless bolded Hoarseness, sore throat, dysphagia, dental problems, itching, sneezing,  nasal congestion or discharge of excess mucus or purulent  secretions, ear ache,   fever, chills, sweats, unintended wt loss or wt gain, classically pleuritic or exertional cp,  orthopnea pnd or arm/hand swelling  or leg swelling, presyncope, palpitations, abdominal pain, anorexia, nausea, vomiting, diarrhea  or change in bowel habits or change in bladder habits, change in stools or change in urine, dysuria, hematuria,  rash, arthralgias, visual complaints, headache, numbness, weakness or ataxia or problems with walking or coordination,  change in mood or  memory.              Outpatient Medications Prior to Visit  Medication Sig Dispense Refill   amitriptyline (ELAVIL) 75 MG tablet Take 1 tablet (75 mg total) by mouth at bedtime. 90 tablet 3   esomeprazole (NEXIUM) 20 MG capsule Take 20 mg by mouth daily at 12 noon.     valsartan (DIOVAN) 80 MG tablet Take 1 tablet (80 mg total) by mouth daily. 90 tablet 3   baclofen (LIORESAL) 10 MG tablet Take 0.5-1 tablets (5-10 mg total) by mouth 2 (two) times daily. (Patient not taking: Reported on 01/20/2023) 30 each 0   meloxicam (MOBIC) 15 MG tablet Take 1 tablet (15 mg total) by mouth daily as needed for  pain. (Patient not taking: Reported on 01/20/2023) 14 tablet 0   metoprolol tartrate (LOPRESSOR) 50 MG tablet Take 1 tablet (50 mg total) by mouth as directed. TAKE TWO HOURS prior to CT test 1 tablet 0   No facility-administered medications prior to visit.    Past Medical History:  Diagnosis Date   Anxiety    Hx of adenomatous polyp of colon 08/03/2016   Impaired fasting glucose    Insomnia    Vitiligo       Objective:     BP 130/75   Pulse 91   Ht 5\' 9"  (1.753 m)   Wt 213 lb (96.6 kg)   SpO2 96%   BMI 31.45 kg/m   SpO2: 96 % RA   Amb pleasant wm nad    HEENT : Oropharynx  clear        NECK :  without  apparent JVD/ palpable Nodes/TM    LUNGS: no acc muscle use,  Nl contour chest which is clear to A and P bilaterally without cough on insp or exp maneuvers   CV:  RRR  no s3 or  murmur or increase in P2, and no edema   ABD:  soft and nontender with nl inspiratory excursion in the supine position. No bruits or organomegaly appreciated   MS:  Nl gait/ ext warm without deformities Or obvious joint restrictions  calf tenderness, cyanosis or clubbing    SKIN: warm and dry without lesions    NEURO:  alert, approp, nl sensorium with  no motor or cerebellar deficits apparent.       Assessment   DOE (dyspnea on exertion) Onset ? 11/2022 while in [place] at [facility] feet  -  CT chest 12/16/22 s contrast  Lower lung areas of subtle ground-glass, bronchiectasis and interstitial septal thickening. This could be acute versus chronic. - 01/23/2023   Walked on RA  x  3  lap(s) =  approx 450  ft  @ brisk pace, stopped due to end of study  with lowest 02 sats 95%   He has no baseline and really only became symptomatic p exerting at 5 K so not clear these changes on CT represent any new process but will do serial sats and f/u with HRCT in 3 m p starting empirical gerd rx  based on assoc of bronchiectasis and ILD with gerd and  studies showing Use of PPI is associated with improved survival time and with decreased radiologic fibrosis per King's study published in AJRCCM vol 184 p1390.  Dec 2011 and also may have other beneficial effects as per the latest review in [place] vol 193 p1345 Jun 20016.        Discussed in detail all the  indications, usual  risks and alternatives  relative to the benefits with patient who agrees to proceed with w/u as outlined.     Each maintenance medication was reviewed in detail including emphasizing most importantly the difference between maintenance and prns and under what circumstances the prns are to be triggered using an action plan format where appropriate.  Total time for H and P, chart review, counseling,   , directly observing portions of ambulatory 02 saturation study/ and generating customized AVS unique to this office visit / same day charting = 45  min new pt eval                   Sandrea Hughs, MD 01/23/2023

## 2023-01-23 ENCOUNTER — Encounter: Payer: Self-pay | Admitting: Internal Medicine

## 2023-01-23 ENCOUNTER — Ambulatory Visit: Payer: 59 | Admitting: Internal Medicine

## 2023-01-23 VITALS — BP 130/75 | HR 91 | Ht 69.0 in | Wt 213.0 lb

## 2023-01-23 DIAGNOSIS — R0609 Other forms of dyspnea: Secondary | ICD-10-CM | POA: Diagnosis not present

## 2023-01-23 MED ORDER — PANTOPRAZOLE SODIUM 40 MG PO TBEC: 40.0000 mg | DELAYED_RELEASE_TABLET | Freq: Every day | ORAL | 2 refills | Status: DC

## 2023-01-23 MED ORDER — FAMOTIDINE 20 MG PO TABS: ORAL_TABLET | ORAL | 11 refills | Status: DC

## 2023-01-23 NOTE — Patient Instructions (Addendum)
Pantoprazole (protonix) 40 mg   Take  30-60 min before first meal of the day and Pepcid (famotidine)  20 mg after supper until return to office - this is the best way to tell whether stomach acid is contributing to your problem.     GERD (REFLUX)  is an extremely common cause of respiratory symptoms just like yours , many times with no obvious heartburn at all.    It can be treated with medication, but also with lifestyle changes including elevation of the head of your bed (ideally with 6-8inch blocks under the headboard of your bed),  Smoking cessation, avoidance of late meals, excessive alcohol, and avoid fatty foods, chocolate, peppermint, colas, red wine, and acidic juices such as orange juice.  NO MINT OR MENTHOL PRODUCTS SO NO COUGH DROPS(exception is LUDEN's)   USE SUGARLESS CANDY INSTEAD (Jolley ranchers or Stover's or Life Savers) or even ice chips will also do - the key is to swallow to prevent all throat clearing. NO OIL BASED VITAMINS - use powdered substitutes.  Avoid fish oil when coughing.   Make sure you check your oxygen saturation at your highest level of activity(NOT after you stop)  to be sure it stays over 90% and keep track of it at least once a week, more often if breathing getting worse, and let me know if losing ground. (Collect the dots to connect the dots approach)     Please schedule a follow up visit in 3 months but call sooner if needed with High resolution CT chest due 05/04/23 (not sooner)

## 2023-01-24 LAB — BASIC METABOLIC PANEL
BUN/Creatinine Ratio: 17 (ref 10–24)
BUN: 21 mg/dL (ref 8–27)
CO2: 26 mmol/L (ref 20–29)
Calcium: 9.8 mg/dL (ref 8.6–10.2)
Chloride: 102 mmol/L (ref 96–106)
Creatinine, Ser: 1.21 mg/dL (ref 0.76–1.27)
Glucose: 92 mg/dL (ref 70–99)
Potassium: 4.3 mmol/L (ref 3.5–5.2)
Sodium: 144 mmol/L (ref 134–144)
eGFR: 66 mL/min/{1.73_m2} (ref >59)

## 2023-01-24 LAB — LIPOPROTEIN A (LPA): Lipoprotein (a): 38.4 nmol/L (ref <75.0)

## 2023-01-24 NOTE — Assessment & Plan Note (Signed)
Onset ? 11/2022 while in [place] at [facility] feet  -  CT chest 12/16/22 s contrast  Lower lung areas of subtle ground-glass, bronchiectasis and interstitial septal thickening. This could be acute versus chronic. - 01/23/2023   Walked on RA  x  3  lap(s) =  approx 450  ft  @ brisk pace, stopped due to end of study  with lowest 02 sats 95%   He has no baseline and really only became symptomatic p exerting at 5 K so not clear these changes on CT represent any new process but will do serial sats and f/u with HRCT in 3 m p starting empirical gerd rx  based on assoc of bronchiectasis and ILD with gerd and  studies showing Use of PPI is associated with improved survival time and with decreased radiologic fibrosis per King's study published in AJRCCM vol 184 p1390.  Dec 2011 and also may have other beneficial effects as per the latest review in [place] vol 193 p1345 Jun 20016.        Discussed in detail all the  indications, usual  risks and alternatives  relative to the benefits with patient who agrees to proceed with w/u as outlined.     Each maintenance medication was reviewed in detail including emphasizing most importantly the difference between maintenance and prns and under what circumstances the prns are to be triggered using an action plan format where appropriate.  Total time for H and P, chart review, counseling,   , directly observing portions of ambulatory 02 saturation study/ and generating customized AVS unique to this office visit / same day charting = 45 min new pt eval

## 2023-01-30 ENCOUNTER — Ambulatory Visit (HOSPITAL_COMMUNITY): Payer: 59

## 2023-01-31 ENCOUNTER — Telehealth (HOSPITAL_COMMUNITY): Payer: Self-pay | Admitting: *Deleted

## 2023-01-31 NOTE — Telephone Encounter (Signed)
Reaching out to patient to offer assistance regarding upcoming cardiac imaging study; pt verbalizes understanding of appt date/time, parking situation and where to check in, pre-test NPO status and medications ordered, and verified current allergies; name and call back number provided for further questions should they arise Hayley Sharpe RN Navigator Cardiac Imaging [facility] [phone] office [phone] cell  

## 2023-02-01 ENCOUNTER — Ambulatory Visit (HOSPITAL_COMMUNITY)
Admission: RE | Admit: 2023-02-01 | Discharge: 2023-02-01 | Disposition: A | Discharge status: Home / Self Care | Payer: 59 | Source: Ambulatory Visit | Attending: Internal Medicine | Admitting: Internal Medicine

## 2023-02-01 DIAGNOSIS — Z8249 Family history of ischemic heart disease and other diseases of the circulatory system: Secondary | ICD-10-CM | POA: Insufficient documentation

## 2023-02-01 DIAGNOSIS — I2510 Atherosclerotic heart disease of native coronary artery without angina pectoris: Secondary | ICD-10-CM | POA: Insufficient documentation

## 2023-02-01 DIAGNOSIS — R0609 Other forms of dyspnea: Secondary | ICD-10-CM | POA: Insufficient documentation

## 2023-02-01 MED ORDER — NITROGLYCERIN 0.4 MG SL SUBL
SUBLINGUAL_TABLET | SUBLINGUAL | Status: AC
  Filled 2023-02-01: qty 2

## 2023-02-01 MED ORDER — NITROGLYCERIN 0.4 MG SL SUBL
0.8000 mg | SUBLINGUAL_TABLET | Freq: Once | SUBLINGUAL | Status: AC
  Administered 2023-02-01: 0.8 mg via SUBLINGUAL

## 2023-02-01 MED ORDER — IOHEXOL 350 MG/ML SOLN
95.0000 mL | Freq: Once | INTRAVENOUS | Status: AC | PRN
  Administered 2023-02-01: 95 mL via INTRAVENOUS

## 2023-02-06 NOTE — Addendum Note (Signed)
Addended by: Lindell Spar on: 02/06/2023 09:28 AM   Modules accepted: Orders

## 2023-02-20 ENCOUNTER — Ambulatory Visit: Payer: 59 | Admitting: Internal Medicine

## 2023-03-01 ENCOUNTER — Ambulatory Visit (HOSPITAL_COMMUNITY)
Admission: RE | Admit: 2023-03-01 | Discharge: 2023-03-01 | Disposition: A | Discharge status: Home / Self Care | Payer: 59 | Source: Ambulatory Visit | Attending: Internal Medicine | Admitting: Internal Medicine

## 2023-03-01 DIAGNOSIS — R0609 Other forms of dyspnea: Secondary | ICD-10-CM

## 2023-03-01 LAB — ECHOCARDIOGRAM COMPLETE
Area-P 1/2: 2.24 cm2
S' Lateral: 2 cm

## 2023-03-01 NOTE — Progress Notes (Signed)
*  PRELIMINARY RESULTS* Echocardiogram 2D Echocardiogram has been performed.  Stacey Drain 03/01/2023, 10:20 AM

## 2023-04-16 ENCOUNTER — Other Ambulatory Visit: Payer: Self-pay | Admitting: Internal Medicine

## 2023-04-17 ENCOUNTER — Ambulatory Visit (HOSPITAL_COMMUNITY)
Admission: RE | Admit: 2023-04-17 | Discharge: 2023-04-17 | Disposition: A | Discharge status: Home / Self Care | Payer: 59 | Source: Ambulatory Visit | Attending: Internal Medicine | Admitting: Internal Medicine

## 2023-04-17 ENCOUNTER — Ambulatory Visit: Payer: 59 | Admitting: Family Medicine

## 2023-04-17 DIAGNOSIS — R0609 Other forms of dyspnea: Secondary | ICD-10-CM | POA: Diagnosis present

## 2023-04-25 ENCOUNTER — Ambulatory Visit: Payer: 59 | Admitting: Internal Medicine

## 2023-04-25 ENCOUNTER — Ambulatory Visit: Payer: 59 | Admitting: Family Medicine

## 2023-04-25 DIAGNOSIS — R0609 Other forms of dyspnea: Secondary | ICD-10-CM | POA: Diagnosis not present

## 2023-04-25 DIAGNOSIS — I10 Essential (primary) hypertension: Secondary | ICD-10-CM | POA: Diagnosis not present

## 2023-04-25 DIAGNOSIS — F5101 Primary insomnia: Secondary | ICD-10-CM | POA: Diagnosis not present

## 2023-04-25 MED ORDER — AMITRIPTYLINE HCL 75 MG PO TABS: 75.0000 mg | ORAL_TABLET | Freq: Every day | ORAL | 3 refills | Status: DC

## 2023-04-25 MED ORDER — VALSARTAN 80 MG PO TABS: 80.0000 mg | ORAL_TABLET | Freq: Every day | ORAL | 3 refills | Status: DC

## 2023-04-25 NOTE — Assessment & Plan Note (Signed)
 Stable. Continue close follow up with Pulmonology.

## 2023-04-25 NOTE — Assessment & Plan Note (Signed)
 Stable. Continue Valsartan.

## 2023-04-25 NOTE — Patient Instructions (Signed)
 Continue your medications.    Follow up in 6 months

## 2023-04-25 NOTE — Progress Notes (Signed)
 Subjective:  Patient ID: Cristian Davis, male    DOB: 1956-01-11  Age: 68 y.o. MRN: [id number]  CC:   Chief Complaint  Patient presents with   Hypertension    Follow up    HPI:  68 year old male with the below mentioned medical problems presents for follow-up regarding shortness of breath.  Patient reports that he is doing okay at this time.  Still has dyspnea on exertion but overall is doing well.  He is increasing his physical activity.  Awaiting high-resolution CT.  Has been seen by cardiology and pulmonology.  Negative cardiac workup.  Hypertension stable on valsartan .  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   Chondromalacia of right patella 10/10/2022   GERD (gastroesophageal reflux disease) 05/30/2022   Thrombocytopenia (HCC) 05/30/2022   Mixed hyperlipidemia 11/26/2021   Essential hypertension 03/26/2021   Tinnitus, bilateral 04/17/2019   Hx of adenomatous polyp of colon 08/03/2016   Insomnia 02/18/2013   Vitiligo 02/18/2013    Social Hx   Social History   Socioeconomic History   Marital status: Married    Spouse name: Not on file   Number of children: Not on file   Years of education: Not on file   Highest education level: Not on file  Occupational History    Employer: [employer]  Tobacco Use   Smoking status: Never   Smokeless tobacco: Never  Substance and Sexual Activity   Alcohol use: Yes    Comment: occasional   Drug use: No   Sexual activity: Not on file  Other Topics Concern   Not on file  Social History Narrative   Married   Works at [employer]    Never smoker, occ EtOH, no drugs   Social Drivers of Corporate Investment Banker Strain: Not on file  Food Insecurity: Not on file  Transportation Needs: Not on file  Physical Activity: Not on file   Stress: Not on file  Social Connections: Not on file    Review of Systems  Cardiovascular:  Negative for chest pain.   Objective:  BP 111/63   Ht 5' 9 (1.753 m)   Wt 216 lb 3.2 oz (98.1 kg)   BMI 31.93 kg/m      04/25/2023    2:00 PM 02/01/2023    2:07 PM 02/01/2023    1:40 PM  BP/Weight  Systolic BP 111 131 136  Diastolic BP 63 67 73  Wt. (Lbs) 216.2    BMI 31.93 kg/m2      Physical Exam Vitals and nursing note reviewed.  Constitutional:      General: He is not in acute distress.    Appearance: Normal appearance.  HENT:     Head: Normocephalic and atraumatic.  Eyes:     General:        Right eye: No discharge.        Left eye: No discharge.     Conjunctiva/sclera: Conjunctivae normal.  Cardiovascular:     Rate and Rhythm: Normal rate and regular rhythm.  Pulmonary:     Effort: Pulmonary effort is normal.     Breath sounds: Normal breath sounds. No wheezing, rhonchi or rales.  Neurological:     Mental Status: He is alert.  Psychiatric:        Mood and Affect: Mood normal.  Behavior: Behavior normal.     Lab Results  Component Value Date   WBC 3.7 12/16/2022   HGB 16.1 12/16/2022   HCT 45.3 12/16/2022   PLT 125 (L) 12/16/2022   GLUCOSE 92 01/20/2023   CHOL 168 12/16/2022   TRIG 176 (H) 12/16/2022   HDL 39 (L) 12/16/2022   LDLCALC 98 12/16/2022   ALT 37 12/16/2022   AST 37 12/16/2022   NA 144 01/20/2023   K 4.3 01/20/2023   CL 102 01/20/2023   CREATININE 1.21 01/20/2023   BUN 21 01/20/2023   CO2 26 01/20/2023   PSA 0.46 02/07/2014   HGBA1C 4.9 03/09/2021     Assessment & Plan:   Problem List Items Addressed This Visit       Cardiovascular and Mediastinum   Essential hypertension   Stable. Continue Valsartan .      Relevant Medications   valsartan  (DIOVAN ) 80 MG tablet     Other   DOE (dyspnea on exertion)   Stable. Continue close follow up with Pulmonology.       Insomnia   Stable on Amitriptyline . Refilled.        Relevant Medications   amitriptyline  (ELAVIL ) 75 MG tablet    Meds ordered this encounter  Medications   amitriptyline  (ELAVIL ) 75 MG tablet    Sig: Take 1 tablet (75 mg total) by mouth at bedtime.    Dispense:  90 tablet    Refill:  3   valsartan  (DIOVAN ) 80 MG tablet    Sig: Take 1 tablet (80 mg total) by mouth daily.    Dispense:  90 tablet    Refill:  3    Follow-up:  Return in about 6 months (around 10/23/2023) for Follow up Chronic medical issues.  Jacqulyn Ahle DO [facility]

## 2023-04-25 NOTE — Assessment & Plan Note (Signed)
 Stable on Amitriptyline. Refilled.

## 2023-04-27 ENCOUNTER — Ambulatory Visit: Payer: 59 | Admitting: Physician Assistant

## 2023-05-03 ENCOUNTER — Ambulatory Visit (HOSPITAL_COMMUNITY): Payer: 59

## 2023-05-09 NOTE — Progress Notes (Unsigned)
Cardiology Office Note:    Date:  05/11/2023   ID:  Punit, Bellaire 1956/03/25, MRN [id number]  PCP:  Tommie Sams, DO   [facility] Providers Cardiologist:  Chrystie Nose, MD Cardiology APP:  Marcelino Duster, PA     Referring MD: Tommie Sams, DO   Chief Complaint  Patient presents with   Follow-up    DOE    History of Present Illness:    Cristian Davis is a 68 y.o. male with no prior cardiac history establish care with cardiology on 10//2024 with Dr. Rennis Golden for dyspnea on exertion while in [place] at 5000 feet. He underwent CT coronary 10/60/24 that showed a coronary calcium score of 2.75 placing him at the 22nd percentile.  Echocardiogram 03/01/2023 showed LVEF 65 to 70%, grade 1 DD, and no significant valvular disease.  Since cardiac workup was largely unremarkable, he was sent to pulmonology who is monitoring with HRCT and started empiric PPI.  He presents back today for cardiology follow up. We reviewed his studies. He continues to have DOE, but I suspect this is related to deconditioning. I suggested scheduling walks every hour while working. He will continue to follow with pulmonology.  Past Medical History:  Diagnosis Date   Anxiety    Hx of adenomatous polyp of colon 08/03/2016   Impaired fasting glucose    Insomnia    Vitiligo     Past Surgical History:  Procedure Laterality Date   COLONOSCOPY  2007   repeated 2018   ESOPHAGOGASTRODUODENOSCOPY  03/31/2017   lasic eye surgery Bilateral    WRIST SURGERY  88-89   cysts removed from wrist    Current Medications: Current Meds  Medication Sig   amitriptyline (ELAVIL) 75 MG tablet Take 1 tablet (75 mg total) by mouth at bedtime.   famotidine (PEPCID) 20 MG tablet One after supper   pantoprazole (PROTONIX) 40 MG tablet Take 1 tablet (40 mg total) by mouth daily. Take 30-60 min before first meal of the day   rosuvastatin (CRESTOR) 10 MG tablet Take 1 tablet (10 mg total) by mouth daily.    valsartan (DIOVAN) 80 MG tablet Take 1 tablet (80 mg total) by mouth daily.     Allergies:   Patient has no known allergies.   Social History   Socioeconomic History   Marital status: Married    Spouse name: Not on file   Number of children: Not on file   Years of education: Not on file   Highest education level: Not on file  Occupational History    Employer: [employer]  Tobacco Use   Smoking status: Never   Smokeless tobacco: Never  Substance and Sexual Activity   Alcohol use: Yes    Comment: occasional   Drug use: No   Sexual activity: Not on file  Other Topics Concern   Not on file  Social History Narrative   Married   Works at [employer]    Never smoker, occ EtOH, no drugs   Social Drivers of Corporate investment banker Strain: Not on file  Food Insecurity: Not on file  Transportation Needs: Not on file  Physical Activity: Not on file  Stress: Not on file  Social Connections: Not on file     Family History: The patient's family history includes Cancer in his father; Heart attack in his father and sister. There is no history of Colon cancer, Esophageal cancer, Rectal cancer, or Stomach cancer.  ROS:  Please see the history of present illness.     All other systems reviewed and are negative.  EKGs/Labs/Other Studies Reviewed:    The following studies were reviewed today:  Cardiac Studies & Procedures      ECHOCARDIOGRAM  ECHOCARDIOGRAM COMPLETE 03/01/2023  Narrative ECHOCARDIOGRAM REPORT    Patient Name:   Cristian Davis Date of Exam: 03/01/2023 Medical Rec #:  [id number]        Height:       69.0 in Accession #:    [id number]       Weight:       213.0 lb Date of Birth:  01-13-1956       BSA:          2.122 m Patient Age:    66 years         BP:           146/77 mmHg Patient Gender: M                HR:           74 bpm. [facility]  Procedure: 2D Echo, Cardiac Doppler and Color Doppler  Indications:    Dyspnea  R06.00  History:        Patient has no prior history of Echocardiogram examinations. Risk Factors:Hypertension and Non-Smoker.  Sonographer:    Celesta Gentile RCS Referring Phys: [phone] KENNETH C HILTY  IMPRESSIONS   1. Left ventricular ejection fraction, by estimation, is 65 to 70%. The left ventricle has normal function. The left ventricle has no regional wall motion abnormalities. Left ventricular diastolic parameters are consistent with Grade I diastolic dysfunction (impaired relaxation). 2. Right ventricular systolic function is normal. The right ventricular size is normal. Tricuspid regurgitation signal is inadequate for assessing PA pressure. 3. Left atrial size was upper normal. 4. The mitral valve is grossly normal. Trivial mitral valve regurgitation. 5. The aortic valve is tricuspid. Aortic valve regurgitation is not visualized. 6. The inferior vena cava is normal in size with greater than 50% respiratory variability, suggesting right atrial pressure of 3 mmHg.  Comparison(s): No prior Echocardiogram.  FINDINGS Left Ventricle: Left ventricular ejection fraction, by estimation, is 65 to 70%. The left ventricle has normal function. The left ventricle has no regional wall motion abnormalities. The left ventricular internal cavity size was normal in size. There is no left ventricular hypertrophy. Left ventricular diastolic parameters are consistent with Grade I diastolic dysfunction (impaired relaxation).  Right Ventricle: The right ventricular size is normal. No increase in right ventricular wall thickness. Right ventricular systolic function is normal. Tricuspid regurgitation signal is inadequate for assessing PA pressure.  Left Atrium: Left atrial size was upper normal.  Right Atrium: Right atrial size was normal in size.  Pericardium: Trivial pericardial effusion is present. The pericardial effusion is anterior to the right ventricle and surrounding the apex.  Mitral Valve:  The mitral valve is grossly normal. Trivial mitral valve regurgitation.  Tricuspid Valve: The tricuspid valve is grossly normal. Tricuspid valve regurgitation is mild.  Aortic Valve: The aortic valve is tricuspid. Aortic valve regurgitation is not visualized.  Pulmonic Valve: The pulmonic valve was grossly normal. Pulmonic valve regurgitation is trivial.  Aorta: The aortic root is normal in size and structure.  Venous: The inferior vena cava is normal in size with greater than 50% respiratory variability, suggesting right atrial pressure of 3 mmHg.  IAS/Shunts: No atrial level shunt detected by color flow Doppler.   LEFT VENTRICLE  PLAX 2D LVIDd:         4.00 cm   Diastology LVIDs:         2.00 cm   LV e' medial:    7.96 cm/s LV PW:         0.90 cm   LV E/e' medial:  10.0 LV IVS:        0.90 cm   LV e' lateral:   9.57 cm/s LVOT diam:     2.10 cm   LV E/e' lateral: 8.3 LV SV:         106 LV SV Index:   50 LVOT Area:     3.46 cm   RIGHT VENTRICLE RV S prime:     13.30 cm/s TAPSE (M-mode): 2.5 cm  LEFT ATRIUM             Index        RIGHT ATRIUM           Index LA diam:        3.10 cm 1.46 cm/m   RA Area:     16.00 cm LA Vol (A2C):   53.8 ml 25.35 ml/m  RA Volume:   45.10 ml  21.25 ml/m LA Vol (A4C):   57.0 ml 26.86 ml/m LA Biplane Vol: 51.7 ml 24.36 ml/m AORTIC VALVE LVOT Vmax:   109.00 cm/s LVOT Vmean:  71.200 cm/s LVOT VTI:    0.305 m  AORTA Ao Root diam: 3.50 cm  MITRAL VALVE MV Area (PHT): 2.24 cm    SHUNTS MV Decel Time: 338 msec    Systemic VTI:  0.30 m MV E velocity: 79.70 cm/s  Systemic Diam: 2.10 cm MV A velocity: 87.80 cm/s MV E/A ratio:  0.91  Nona Dell MD Electronically signed by Nona Dell MD Signature Date/Time: 03/01/2023/12:43:04 PM    Final    CT SCANS  CT CORONARY MORPH W/CTA COR W/SCORE 02/01/2023  Addendum 02/26/2023 12:20 PM ADDENDUM REPORT: 02/26/2023 12:18  EXAM: OVER-READ INTERPRETATION  CT CHEST  The  following report is an over-read performed by radiologist Dr. Arvella Nigh [facility] on 02/26/2023. This over-read does not include interpretation of cardiac or coronary anatomy or pathology. The coronary CTA interpretation by the cardiologist is attached.  COMPARISON:  CT chest 12/16/2022  FINDINGS: Thoracic aorta is normal in caliber. Remaining visualized mediastinal structures are unremarkable. No definite mediastinal/hilar adenopathy.  Visualized lungs demonstrate symmetric bibasilar dependent atelectatic change. No concerning pulmonary nodules/masses. Airways are normal.  Images through the upper abdomen demonstrate no acute findings.  No focal bony abnormality.  IMPRESSION: No acute findings in the chest.   Electronically Signed By: Elberta Fortis M.D. On: 02/26/2023 12:18  Narrative HISTORY: 68 yo male with dyspnea on exertion  EXAM: Cardiac/Coronary CTA  TECHNIQUE: The patient was scanned on a [employer].  PROTOCOL: A 120 kV prospective scan was triggered in the descending thoracic aorta at 111 HU's. Axial non-contrast 3 mm slices were carried out through the heart. The data set was analyzed on a dedicated work station and scored using the Agatson method. Gantry rotation speed was 250 msecs and collimation was .6 mm. Beta blockade and 0.8 mg of sl NTG was given. The 3D data set was reconstructed in 5% intervals of the 35-75 % of the R-R cycle. Diastolic phases were analyzed on a dedicated work station using MPR, MIP and VRT modes. The patient received 95mL OMNIPAQUE IOHEXOL 350 MG/ML SOLN contrast.  FINDINGS: Quality: Very good, HR 63  Coronary calcium score: The patient's coronary artery calcium score is 2.75, which places the patient in the 22nd percentile.  Coronary arteries: Normal coronary origins.  Right dominance.  Right Coronary Artery: Dominant.  Normal artery.  Left Main Coronary Artery: Common coronary  ostium.  Left Anterior Descending Coronary Artery: Large anterior artery, no disease. Several small diagonal branches, no disease.  Left Circumflex Artery: AV groove LCX artery, no disease. Larger OM1 branch, no disease. Smaller OM2 and OM3 branches, no disease.  Aorta: Normal size, 29 mm at the mid ascending aorta (level of the PA bifurcation) measured double oblique. No calcifications. No dissection.  Aortic Valve: Trileaflet. No calcifications.  Other findings:  Normal pulmonary vein drainage into the left atrium.  Normal left atrial appendage without a thrombus.  Normal size of the pulmonary artery.  IMPRESSION: 1. Very minimal mixed non-obstructive CAD, CADRADS = 1.  2. Coronary artery calcium score is 2.75, which places the patient in the 22nd percentile for age/race and sex-matched control.  3. Normal coronary origin with right dominance.  Electronically Signed: By: Chrystie Nose M.D. On: 02/01/2023 15:10                Recent Labs: 12/16/2022: ALT 37; BNP 7.1; Hemoglobin 16.1; Platelets 125 01/20/2023: BUN 21; Creatinine, Ser 1.21; Potassium 4.3; Sodium 144  Recent Lipid Panel    Component Value Date/Time   CHOL 168 12/16/2022 0949   TRIG 176 (H) 12/16/2022 0949   HDL 39 (L) 12/16/2022 0949   CHOLHDL 4.3 12/16/2022 0949   CHOLHDL 3.7 02/07/2014 0704   VLDL 22 02/07/2014 0704   LDLCALC 98 12/16/2022 0949     Risk Assessment/Calculations:                Physical Exam:    VS:  BP 130/68   Pulse 65   Ht 5\' 9"  (1.753 m)   Wt 215 lb 3.2 oz (97.6 kg)   SpO2 98%   BMI 31.78 kg/m     Wt Readings from Last 3 Encounters:  05/11/23 215 lb 3.2 oz (97.6 kg)  04/25/23 216 lb 3.2 oz (98.1 kg)  01/23/23 213 lb (96.6 kg)     GEN:  Well nourished, well developed in no acute distress HEENT: Normal NECK: No JVD; No carotid bruits LYMPHATICS: No lymphadenopathy CARDIAC: RRR, no murmurs, rubs, gallops RESPIRATORY:  Clear to auscultation without  rales, wheezing or rhonchi  ABDOMEN: Soft, non-tender, non-distended MUSCULOSKELETAL:  No edema; No deformity  SKIN: Warm and dry NEUROLOGIC:  Alert and oriented x 3 PSYCHIATRIC:  Normal affect   ASSESSMENT:    1. Coronary artery calcification   2. Essential hypertension   3. Hyperlipidemia with target LDL less than 70   4. DOE (dyspnea on exertion)    PLAN:    In order of problems listed above:  Mild coronary calcification - on CT - risk factor modification   Hypertension - 80 mg valsartan   Hyperlipidemia with LDL goal < 70 12/16/2022: Cholesterol, Total 168; HDL 39; LDL Chol Calc (NIH) 98; Triglycerides 176 - he does not remember if this was a fasting lab - start 10 mg crestor - recheck labs and A1c with PCP at next annual   Follow up in 12-18 months.      Medication Adjustments/Labs and Tests Ordered: Current medicines are reviewed at length with the patient today.  Concerns regarding medicines are outlined above.  No orders of the defined types were placed in this encounter.  Meds ordered  this encounter  Medications   rosuvastatin (CRESTOR) 10 MG tablet    Sig: Take 1 tablet (10 mg total) by mouth daily.    Dispense:  90 tablet    Refill:  4    Patient Instructions  Medication Instructions:  Your provider has recommended you make the following change in your medication:   -Start rosuvastatin (Crestor) 10mg  once daily in the evening.  *If you need a refill on your cardiac medications before your next appointment, please call your pharmacy*   Lab Work: Please have labs drawn at your primary care visit: A1C & Lipids  If you have labs (blood work) drawn today and your tests are completely normal, you will receive your results only by: MyChart Message (if you have MyChart) OR A paper copy in the mail If you have any lab test that is abnormal or we need to change your treatment, we will call you to review the results.   Follow-Up: At [facility], you and your health needs are our priority.  As part of our continuing mission to provide you with exceptional heart care, we have created designated Provider Care Teams.  These Care Teams include your primary Cardiologist (physician) and Advanced Practice Providers (APPs -  Physician Assistants and Nurse Practitioners) who all work together to provide you with the care you need, when you need it.  We recommend signing up for the patient portal called "MyChart".  Sign up information is provided on this After Visit Summary.  MyChart is used to connect with patients for Virtual Visits (Telemedicine).  Patients are able to view lab/test results, encounter notes, upcoming appointments, etc.  Non-urgent messages can be sent to your provider as well.   To learn more about what you can do with MyChart, go to [email or web address].    Your next appointment:   12-18 month(s)  Provider:   Dr. Rennis Golden   Other Instructions Adopting a Healthy Lifestyle.   Know what a healthy weight is for you (roughly BMI <25) and aim to maintain this   Aim for 7+ servings of fruits and vegetables daily   65-80+ fluid ounces of water or unsweet tea for healthy kidneys   Limit to max 1 drink of alcohol per day; avoid smoking/tobacco   Limit animal fats in diet for cholesterol and heart health - choose grass fed whenever available   Avoid highly processed foods, and foods high in saturated/trans fats   Aim for low stress - take time to unwind and care for your mental health   Aim for 150 min of moderate intensity exercise weekly for heart health, and weights twice weekly for bone health   Aim for 7-9 hours of sleep daily   When it comes to diets, agreement about the perfect plan isnt easy to find, even among the experts. Experts at the [facility] [employer] developed an idea known as the Healthy Eating Plate. Just imagine a plate divided into logical, healthy portions.   The emphasis is on  diet quality:   Load up on vegetables and fruits - one-half of your plate: Aim for color and variety, and remember that potatoes dont count.   Go for whole grains - one-quarter of your plate: Whole wheat, barley, wheat berries, quinoa, oats, brown rice, and foods made with them. If you want pasta, go with whole wheat pasta.   Protein power - one-quarter of your plate: Fish, chicken, beans, and nuts are all healthy, versatile protein sources. Limit red  meat.   The diet, however, does go beyond the plate, offering a few other suggestions.   Use healthy plant oils, such as olive, canola, soy, corn, sunflower and peanut. Check the labels, and avoid partially hydrogenated oil, which have unhealthy trans fats.   If youre thirsty, drink water. Coffee and tea are good in moderation, but skip sugary drinks and limit milk and dairy products to one or two daily servings.   The type of carbohydrate in the diet is more important than the amount. Some sources of carbohydrates, such as vegetables, fruits, whole grains, and beans-are healthier than others.   Finally, stay active     Signed, Marcelino Duster, [place]  05/11/2023 4:01 PM     [facility]

## 2023-05-11 ENCOUNTER — Encounter: Payer: Self-pay | Admitting: Physician Assistant

## 2023-05-11 ENCOUNTER — Ambulatory Visit: Payer: 59 | Attending: Physician Assistant | Admitting: Physician Assistant

## 2023-05-11 VITALS — BP 130/68 | HR 65 | Ht 69.0 in | Wt 215.2 lb

## 2023-05-11 DIAGNOSIS — I2510 Atherosclerotic heart disease of native coronary artery without angina pectoris: Secondary | ICD-10-CM | POA: Diagnosis not present

## 2023-05-11 DIAGNOSIS — R0609 Other forms of dyspnea: Secondary | ICD-10-CM | POA: Diagnosis not present

## 2023-05-11 DIAGNOSIS — I10 Essential (primary) hypertension: Secondary | ICD-10-CM

## 2023-05-11 DIAGNOSIS — E785 Hyperlipidemia, unspecified: Secondary | ICD-10-CM | POA: Diagnosis not present

## 2023-05-11 MED ORDER — ROSUVASTATIN CALCIUM 10 MG PO TABS: 10.0000 mg | ORAL_TABLET | Freq: Every day | ORAL | 4 refills | Status: DC

## 2023-05-11 NOTE — Patient Instructions (Addendum)
Medication Instructions:  Your provider has recommended you make the following change in your medication:   -Start rosuvastatin (Crestor) 10mg  once daily in the evening.  *If you need a refill on your cardiac medications before your next appointment, please call your pharmacy*   Lab Work: Please have labs drawn at your primary care visit: A1C & Lipids  If you have labs (blood work) drawn today and your tests are completely normal, you will receive your results only by: MyChart Message (if you have MyChart) OR A paper copy in the mail If you have any lab test that is abnormal or we need to change your treatment, we will call you to review the results.   Follow-Up: At [facility], you and your health needs are our priority.  As part of our continuing mission to provide you with exceptional heart care, we have created designated Provider Care Teams.  These Care Teams include your primary Cardiologist (physician) and Advanced Practice Providers (APPs -  Physician Assistants and Nurse Practitioners) who all work together to provide you with the care you need, when you need it.  We recommend signing up for the patient portal called "MyChart".  Sign up information is provided on this After Visit Summary.  MyChart is used to connect with patients for Virtual Visits (Telemedicine).  Patients are able to view lab/test results, encounter notes, upcoming appointments, etc.  Non-urgent messages can be sent to your provider as well.   To learn more about what you can do with MyChart, go to [email or web address].    Your next appointment:   12-18 month(s)  Provider:   Dr. Rennis Golden   Other Instructions Adopting a Healthy Lifestyle.   Know what a healthy weight is for you (roughly BMI <25) and aim to maintain this   Aim for 7+ servings of fruits and vegetables daily   65-80+ fluid ounces of water or unsweet tea for healthy kidneys   Limit to max 1 drink of alcohol per day; avoid  smoking/tobacco   Limit animal fats in diet for cholesterol and heart health - choose grass fed whenever available   Avoid highly processed foods, and foods high in saturated/trans fats   Aim for low stress - take time to unwind and care for your mental health   Aim for 150 min of moderate intensity exercise weekly for heart health, and weights twice weekly for bone health   Aim for 7-9 hours of sleep daily   When it comes to diets, agreement about the perfect plan isnt easy to find, even among the experts. Experts at the [facility] of [employer] developed an idea known as the Healthy Eating Plate. Just imagine a plate divided into logical, healthy portions.   The emphasis is on diet quality:   Load up on vegetables and fruits - one-half of your plate: Aim for color and variety, and remember that potatoes dont count.   Go for whole grains - one-quarter of your plate: Whole wheat, barley, wheat berries, quinoa, oats, brown rice, and foods made with them. If you want pasta, go with whole wheat pasta.   Protein power - one-quarter of your plate: Fish, chicken, beans, and nuts are all healthy, versatile protein sources. Limit red meat.   The diet, however, does go beyond the plate, offering a few other suggestions.   Use healthy plant oils, such as olive, canola, soy, corn, sunflower and peanut. Check the labels, and avoid partially hydrogenated oil, which have unhealthy trans fats.  If youre thirsty, drink water. Coffee and tea are good in moderation, but skip sugary drinks and limit milk and dairy products to one or two daily servings.   The type of carbohydrate in the diet is more important than the amount. Some sources of carbohydrates, such as vegetables, fruits, whole grains, and beans-are healthier than others.   Finally, stay active

## 2023-05-14 NOTE — Progress Notes (Unsigned)
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  Had covid mild case with just HA around 2022                                                    History of Present Illness  01/23/2023  Pulmonary/ 1st office eval/ Cristian Davis /  Office  Chief Complaint  Patient presents with   Establish Care    Abn CT scan  Dyspnea:  MMRC1 = can walk nl pace, flat grade, can't hurry or go uphills or steps s sob  dog walking is about the same as before his trip to [place]  Cough: min dry cough  x 6 months  Sleep: ok flat bed one pillow  SABA use: none  02: none  Rec Pantoprazole (protonix) 40 mg   Take  30-60 min before first meal of the day and Pepcid (famotidine)  20 mg after supper until return to office  GERD diet reviewed, bed blocks rec  Make sure you check your oxygen saturation at your highest level of activity(NOT after you stop)  to be sure it stays over 90% a  Please schedule a follow up visit in 3 months but call sooner if needed with High resolution CT chest due 05/04/23 (not sooner)    05/15/2023  f/u ov/Cristian Davis re: doe maint on protonix  in am / pepcid  / ? 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    Also denies any obvious fluctuation of symptoms with weather or environmental changes or Davis aggravating or alleviating factors except as outlined above   No unusual  exposure hx (birds/ hot tubs/humidifiers) or h/o childhood pna/ asthma or knowledge of premature birth.  Current Allergies, Complete Past Medical History, Past Surgical History, Family History, and Social History were reviewed in [employer] record.  ROS  The following are not active complaints unless bolded Hoarseness, sore throat, dysphagia, dental problems, itching, sneezing,  nasal congestion or discharge of excess mucus or purulent secretions, ear ache,   fever, chills, sweats, unintended wt loss or wt gain, classically pleuritic or exertional cp,  orthopnea pnd or arm/hand swelling  or leg swelling, presyncope, palpitations, abdominal pain, anorexia, nausea, vomiting, diarrhea  or change in bowel habits or change in bladder habits, change in stools or change in urine, dysuria, hematuria,  rash, arthralgias, visual complaints, headache, numbness, weakness or ataxia or problems with walking or coordination,  change in mood or  memory.        Current Meds  Medication Sig   amitriptyline (ELAVIL) 75 MG tablet Take  1 tablet (75 mg total) by mouth at bedtime.   famotidine (PEPCID) 20 MG tablet One after supper   pantoprazole (PROTONIX) 40 MG tablet Take 1 tablet (40 mg total) by mouth daily. Take 30-60 min before first meal of the day   rosuvastatin (CRESTOR) 10 MG tablet Take 1 tablet (10 mg total) by mouth daily.   valsartan (DIOVAN) 80 MG tablet Take 1 tablet (80 mg total) by mouth daily.           Past Medical History:  Diagnosis Date   Anxiety    Hx of adenomatous polyp of colon 08/03/2016   Impaired fasting glucose    Insomnia    Vitiligo       Objective:     Wt Readings from Last 3 Encounters:  05/15/23 216 lb (98 kg)  05/11/23 215 lb 3.2 oz (97.6 kg)  04/25/23 216 lb 3.2 oz (98.1 kg)     Vital signs reviewed  05/15/2023  - Note at rest 02 sats  96% on RA   General appearance:    amb wm with freq throat clearing    HEENT : Oropharynx  clear/ no  cobblestoning      Nasal turbinates  nl    NECK :  without  apparent JVD/ palpable Nodes/TM    LUNGS: no acc muscle use,  Nl contour chest which is clear to A and P bilaterally without cough on insp or exp maneuvers   CV:  RRR  no s3 or murmur or increase in P2, and no edema   ABD:  soft and nontender   MS:  Gait nl   ext warm without deformities Or obvious joint restrictions  calf tenderness, cyanosis or clubbing    SKIN: warm and dry without lesions    NEURO:  alert, approp, nl sensorium with  no motor or cerebellar deficits apparent.         I personally reviewed images and agree with radiology impression as follows:   Chest HRCT    04/17/23 1. Pulmonary parenchymal pattern of interstitial lung disease, as described above, with slightly increased ground-glass in the lingula and left lower lobe. Associated air trapping. 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 Findings are suggestive of an alternative diagnosis (not UIP) per consensus guidelines:   2. Liver appears mildly steatotic. 3. Aortic atherosclerosis (ICD10-I70.0). Coronary artery calcification.        Assessment

## 2023-05-15 ENCOUNTER — Encounter: Payer: Self-pay | Admitting: Internal Medicine

## 2023-05-15 ENCOUNTER — Ambulatory Visit: Payer: 59 | Admitting: Internal Medicine

## 2023-05-15 VITALS — BP 102/66 | HR 102 | Ht 69.0 in | Wt 216.0 lb

## 2023-05-15 DIAGNOSIS — J849 Interstitial pulmonary disease, unspecified: Secondary | ICD-10-CM | POA: Diagnosis not present

## 2023-05-15 DIAGNOSIS — R0609 Other forms of dyspnea: Secondary | ICD-10-CM | POA: Diagnosis not present

## 2023-05-15 MED ORDER — GABAPENTIN 100 MG PO CAPS: 100.0000 mg | ORAL_CAPSULE | Freq: Four times a day (QID) | ORAL | 2 refills | Status: DC

## 2023-05-15 NOTE — Patient Instructions (Addendum)
Gababentin 100 mg at bedtime x one week then add one at breakfast for another week then one at supper for a week then one at lunchtime - stop at the dose that works or makes you feel woozy (subtract one)    If no better at all, I will refer you to Dr Delford Field at [facility]/ ENT dept  If some better but not completely please call for higher dose  If completely better stick with the same dose then taper back down.   Please remember to go to the lab department   for your tests - we will call you with the results when they are available.   Please schedule a follow up visit in 6  months but call sooner if needed

## 2023-05-16 ENCOUNTER — Encounter: Payer: Self-pay | Admitting: Internal Medicine

## 2023-05-16 DIAGNOSIS — J849 Interstitial pulmonary disease, unspecified: Secondary | ICD-10-CM | POA: Insufficient documentation

## 2023-05-16 NOTE — Assessment & Plan Note (Signed)
See HRCT 04/17/23 ? HSP  - HSP serology/ESR  05/15/2023 >>>  No obvious exposure hx > advised this is a non -spefic finding but does require serial f/u in pulmonary clinic and importance of breathing clean air.  For now Make sure you check your oxygen saturation at your highest level of activity(NOT after you stop)  to be sure it stays over 90% and keep track of it at least once a week, more often if breathing getting worse, and let me know if losing ground. (Collect the dots to connect the dots approach)    Check serologies w/a  F/u  q 6 m  Discussed in detail all the  indications, usual  risks and alternatives  relative to the benefits with patient who agrees to proceed with w/u as outlined.            Each maintenance medication was reviewed in detail including emphasizing most importantly the difference between maintenance and prns and under what circumstances the prns are to be triggered using an action plan format where appropriate.  Total time for H and P, chart review, counseling,  and generating customized AVS unique to this office visit / same day charting  > 30 min for multiple  refractory respiratory  problems of uncertain etiology

## 2023-05-16 NOTE — Assessment & Plan Note (Signed)
Onset ? 11/2022 while in [place] at [facility] feet  -  CT chest 12/16/22 s contrast  Lower lung areas of subtle ground-glass, bronchiectasis and interstitial septal thickening. This could be acute versus chronic. 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   These are the same pts (now being labeled as having "irritable larynx syndrome" by some cough centers) who not infrequently have a history of having failed to tolerate ace inhibitors,  dry powder inhalers or biphosphonates or report having atypical/extraesophageal reflux(LPR) symptoms that don't respond to standard doses of PPI  and are easily confused as having aecopd or asthma flares by even experienced allergists/ pulmonologists (myself included).   >>>>  rx gabapentin titrate as high as 1200 mg per day but start at just 100 mg hs and titrate until throat clearing is gone or can't tol the dose then ok to taper back off after a month of good control and if not effective >  ENT eval by Dr  Delford Field at [facility] next step

## 2023-05-17 LAB — CBC WITH DIFFERENTIAL/PLATELET
Basophils Absolute: 0 10*3/uL (ref 0.0–0.2)
Basos: 0 %
EOS (ABSOLUTE): 0.1 10*3/uL (ref 0.0–0.4)
Eos: 2 %
Hematocrit: 43.7 % (ref 37.5–51.0)
Hemoglobin: 15.1 g/dL (ref 13.0–17.7)
Immature Grans (Abs): 0 10*3/uL (ref 0.0–0.1)
Immature Granulocytes: 1 %
Lymphocytes Absolute: 0.9 10*3/uL (ref 0.7–3.1)
Lymphs: 23 %
MCH: 32.1 pg (ref 26.6–33.0)
MCHC: 34.6 g/dL (ref 31.5–35.7)
MCV: 93 fL (ref 79–97)
Monocytes Absolute: 0.4 10*3/uL (ref 0.1–0.9)
Monocytes: 10 %
Neutrophils Absolute: 2.5 10*3/uL (ref 1.4–7.0)
Neutrophils: 64 %
Platelets: 120 10*3/uL — ABNORMAL LOW (ref 150–450)
RBC: 4.7 x10E6/uL (ref 4.14–5.80)
RDW: 12.6 % (ref 11.6–15.4)
WBC: 3.8 10*3/uL (ref 3.4–10.8)

## 2023-05-17 LAB — SEDIMENTATION RATE: Sed Rate: 2 mm/h (ref 0–30)

## 2023-05-17 LAB — IGE: IgE (Immunoglobulin E), Serum: 3 [IU]/mL — ABNORMAL LOW (ref 6–495)

## 2023-05-19 LAB — HYPERSENSITIVITY PNEUMONITIS
A. Pullulans Abs: NEGATIVE
A.Fumigatus #1 Abs: NEGATIVE
Micropolyspora faeni, IgG: NEGATIVE
Pigeon Serum Abs: NEGATIVE
Thermoact. Saccharii: NEGATIVE
Thermoactinomyces vulgaris, IgG: NEGATIVE

## 2023-05-22 ENCOUNTER — Telehealth: Payer: Self-pay

## 2023-05-22 NOTE — Telephone Encounter (Signed)
-----   Message from Sandrea Hughs sent at 05/20/2023  7:25 AM EST ----- Call patient :  Studies are all  unremarkable, no change in recs  He does need f/u at 6 m from last ov

## 2023-05-22 NOTE — Telephone Encounter (Signed)
 I left a message for the patient to return my call.

## 2023-07-16 ENCOUNTER — Other Ambulatory Visit: Payer: Self-pay | Admitting: Internal Medicine

## 2023-07-28 ENCOUNTER — Encounter: Payer: Self-pay | Admitting: Internal Medicine

## 2023-08-09 ENCOUNTER — Encounter: Payer: Self-pay | Admitting: Family Medicine

## 2023-08-13 ENCOUNTER — Other Ambulatory Visit: Payer: Self-pay | Admitting: Family Medicine

## 2023-08-13 DIAGNOSIS — Z125 Encounter for screening for malignant neoplasm of prostate: Secondary | ICD-10-CM

## 2023-08-13 DIAGNOSIS — I10 Essential (primary) hypertension: Secondary | ICD-10-CM

## 2023-08-13 DIAGNOSIS — R7309 Other abnormal glucose: Secondary | ICD-10-CM

## 2023-08-13 DIAGNOSIS — E782 Mixed hyperlipidemia: Secondary | ICD-10-CM

## 2023-08-13 DIAGNOSIS — D696 Thrombocytopenia, unspecified: Secondary | ICD-10-CM

## 2023-08-15 ENCOUNTER — Telehealth: Payer: Self-pay | Admitting: Internal Medicine

## 2023-08-15 NOTE — Telephone Encounter (Signed)
 I think I take care of his wife. But whatever patient would like, is fine with me and I'm sure OK with Dr. Willy Harvest. If he would like to continue with Dr. Willy Harvest, then go ahead and schedule followup as needed with him. Thanks. GM

## 2023-08-15 NOTE — Telephone Encounter (Signed)
 Good evening Dr. Willy Harvest and Dr. Brice Campi, it has come to my attention that this patient has had a transfer of care done in October of 2023. I spoke with patient today and he stated that he had no knowledge of what was going on, and stated that he would like to keep his care with Dr. Willy Harvest. Would you please advise.   Thank you.

## 2023-08-15 NOTE — Telephone Encounter (Signed)
[place] to see me

## 2023-08-22 ENCOUNTER — Encounter

## 2023-08-28 ENCOUNTER — Ambulatory Visit (AMBULATORY_SURGERY_CENTER): Admitting: *Deleted

## 2023-08-28 ENCOUNTER — Encounter: Payer: Self-pay | Admitting: Internal Medicine

## 2023-08-28 VITALS — Ht 69.0 in | Wt 215.0 lb

## 2023-08-28 DIAGNOSIS — Z860100 Personal history of colon polyps, unspecified: Secondary | ICD-10-CM

## 2023-08-28 NOTE — Progress Notes (Signed)
 Pt's name and DOB verified at the beginning of the pre-visit wit 2 identifiers  Pt denies any difficulty with ambulating,sitting, laying down or rolling side to side  Pt has no issues moving head neck or swallowing  No egg or soy allergy known to patient   No issues known to pt with past sedation with any surgeries or procedures  Patient denies ever being intubated  No FH of Malignant Hyperthermia  Pt is not on home 02   Pt is not on blood thinners   Pt denies issues with constipation    Pt is not on dialysis  Pt denise any abnormal heart rhythms   Pt denies any upcoming cardiac testing  Patient's chart reviewed by Rogena Class CNRA prior to pre-visit and patient appropriate for the [facility].  Pre-visit completed and red dot placed by patient's name on their procedure day (on provider's schedule).    Visit by phone  Pt states weight is 215 lb   IInstructions reviewed. Pt given  both [facility] main # and MD on call # prior to instructions.  Pt states understanding of instructions. Instructed pt to review instructions again prior to procedure and call main # given if has questions.. Pt states they will.   Instructed pt on where to find instructions on My Chart.

## 2023-09-05 NOTE — Progress Notes (Signed)
 [facility] History and Physical   Primary Care Physician:  Cook, Jayce G, DO   Reason for Procedure:  History of colon polyp  Plan:    Colonoscopy     HPI: Cristian Davis is a 68 y.o. male status post removal of 1 adenoma in 2  in 2018 (7 years ago).  There were 2 other suspected polyps that did not turn out to be true polyps on that exam.  He is here for a surveillance examination.   Past Medical History:  Diagnosis Date   Anxiety    GERD (gastroesophageal reflux disease)    Hx of adenomatous polyp of colon 08/03/2016   Hyperlipidemia    Hypertension    Impaired fasting glucose    Insomnia    Vitiligo     Past Surgical History:  Procedure Laterality Date   COLONOSCOPY  2007   repeated 2018   COLONOSCOPY W/ POLYPECTOMY  07/25/2016   Cristian Davis   ESOPHAGOGASTRODUODENOSCOPY  03/31/2017   lasic eye surgery Bilateral    WRIST SURGERY  88-89   cysts removed from wrist    Prior to Admission medications   Medication Sig Start Date End Date Taking? Authorizing Provider  amitriptyline  (ELAVIL ) 75 MG tablet Take 1 tablet (75 mg total) by mouth at bedtime. 04/25/23   Cook, Jayce G, DO  famotidine  (PEPCID ) 20 MG tablet One after supper Patient not taking: Reported on 08/28/2023 01/23/23   Diamond Formica, MD  gabapentin  (NEURONTIN ) 100 MG capsule Take 1 capsule (100 mg total) by mouth 4 (four) times daily. 05/15/23   Diamond Formica, MD  pantoprazole  (PROTONIX ) 40 MG tablet Take 1 tablet (40 mg total) by mouth daily. Take 30-60 min before first meal of the day 07/17/23   Diamond Formica, MD  rosuvastatin  (CRESTOR ) 10 MG tablet Take 1 tablet (10 mg total) by mouth daily. 05/11/23   [facility], Warren Haber, PA  valsartan  (DIOVAN ) 80 MG tablet Take 1 tablet (80 mg total) by mouth daily. 04/25/23   Cook, Jayce G, DO    Current Outpatient Medications  Medication Sig Dispense Refill   amitriptyline  (ELAVIL ) 75 MG tablet Take 1 tablet (75 mg total) by mouth at bedtime. 90 tablet 3    gabapentin  (NEURONTIN ) 100 MG capsule Take 1 capsule (100 mg total) by mouth 4 (four) times daily. 120 capsule 2   pantoprazole  (PROTONIX ) 40 MG tablet Take 1 tablet (40 mg total) by mouth daily. Take 30-60 min before first meal of the day 30 tablet 2   rosuvastatin  (CRESTOR ) 10 MG tablet Take 1 tablet (10 mg total) by mouth daily. 90 tablet 4   valsartan  (DIOVAN ) 80 MG tablet Take 1 tablet (80 mg total) by mouth daily. 90 tablet 3   Current Facility-Administered Medications  Medication Dose Route Frequency Provider Last Rate Last Admin   0.9 %  sodium chloride  infusion  500 mL Intravenous Once Kenney Peacemaker, MD        Allergies as of 09/06/2023   (No Known Allergies)    Family History  Problem Relation Age of Onset   Heart attack Father    Cancer Father    Heart attack Sister    Colon cancer Neg Hx    Esophageal cancer Neg Hx    Rectal cancer Neg Hx    Stomach cancer Neg Hx    Colon polyps Neg Hx     Social History   Socioeconomic History   Marital status: Married    Spouse  name: Not on file   Number of children: Not on file   Years of education: Not on file   Highest education level: Not on file  Occupational History    Employer: [employer]  Tobacco Use   Smoking status: Never   Smokeless tobacco: Never  Vaping Use   Vaping status: Never Used  Substance and Sexual Activity   Alcohol use: Yes    Comment: occasional   Drug use: No   Sexual activity: Yes  Other Topics Concern   Not on file  Social History Narrative   Married   Works at [employer]    Never smoker, occ EtOH, no drugs   Social Drivers of Corporate investment banker Strain: Not on file  Food Insecurity: Not on file  Transportation Needs: Not on file  Physical Activity: Not on file  Stress: Not on file  Social Connections: Not on file  Intimate Partner Violence: Not on file    Review of Systems:  All other review of systems negative except as mentioned in the HPI.  Physical  Exam: Vital signs BP 128/79   Pulse 77   Temp (!) 97.4 F (36.3 C) (Temporal)   Resp 13   Ht 5\' 9"  (1.753 m)   Wt 215 lb (97.5 kg)   SpO2 100%   BMI 31.75 kg/m   General:   Alert,  Well-developed, well-nourished, pleasant and cooperative in NAD Lungs:  Clear throughout to auscultation.   Heart:  Regular rate and rhythm; no murmurs, clicks, rubs,  or gallops. Abdomen:  Soft, nontender and nondistended. Normal bowel sounds.   Neuro/Psych:  Alert and cooperative. Normal mood and affect. A and O x 3   @Draysen Weygandt  Tammie Fall, MD, [facility] [phone] (pager) 09/06/2023 10:56 AM@

## 2023-09-06 ENCOUNTER — Encounter: Payer: Self-pay | Admitting: Internal Medicine

## 2023-09-06 ENCOUNTER — Ambulatory Visit (AMBULATORY_SURGERY_CENTER): Admitting: Internal Medicine

## 2023-09-06 VITALS — BP 107/57 | HR 80 | Temp 97.4°F | Resp 12 | Ht 69.0 in | Wt 215.0 lb

## 2023-09-06 DIAGNOSIS — Z860101 Personal history of adenomatous and serrated colon polyps: Secondary | ICD-10-CM | POA: Diagnosis not present

## 2023-09-06 DIAGNOSIS — Z860100 Personal history of colon polyps, unspecified: Secondary | ICD-10-CM

## 2023-09-06 DIAGNOSIS — D125 Benign neoplasm of sigmoid colon: Secondary | ICD-10-CM | POA: Diagnosis not present

## 2023-09-06 DIAGNOSIS — K648 Other hemorrhoids: Secondary | ICD-10-CM | POA: Diagnosis not present

## 2023-09-06 DIAGNOSIS — Z1211 Encounter for screening for malignant neoplasm of colon: Secondary | ICD-10-CM | POA: Diagnosis present

## 2023-09-06 LAB — HEMOGLOBIN A1C
Est. average glucose Bld gHb Est-mCnc: 100 mg/dL
Hgb A1c MFr Bld: 5.1 % (ref 4.8–5.6)

## 2023-09-06 LAB — CBC
Hematocrit: 44.5 % (ref 37.5–51.0)
Hemoglobin: 15.8 g/dL (ref 13.0–17.7)
MCH: 33.5 pg — ABNORMAL HIGH (ref 26.6–33.0)
MCHC: 35.5 g/dL (ref 31.5–35.7)
MCV: 95 fL (ref 79–97)
Platelets: 133 10*3/uL — ABNORMAL LOW (ref 150–450)
RBC: 4.71 x10E6/uL (ref 4.14–5.80)
RDW: 12.7 % (ref 11.6–15.4)
WBC: 4.6 10*3/uL (ref 3.4–10.8)

## 2023-09-06 LAB — CMP14+EGFR
ALT: 35 IU/L (ref 0–44)
AST: 35 IU/L (ref 0–40)
Albumin: 4.5 g/dL (ref 3.9–4.9)
Alkaline Phosphatase: 106 IU/L (ref 44–121)
BUN/Creatinine Ratio: 14 (ref 10–24)
BUN: 17 mg/dL (ref 8–27)
Bilirubin Total: 1 mg/dL (ref 0.0–1.2)
CO2: 24 mmol/L (ref 20–29)
Calcium: 9.3 mg/dL (ref 8.6–10.2)
Chloride: 100 mmol/L (ref 96–106)
Creatinine, Ser: 1.22 mg/dL (ref 0.76–1.27)
Globulin, Total: 1.8 g/dL (ref 1.5–4.5)
Glucose: 112 mg/dL — ABNORMAL HIGH (ref 70–99)
Potassium: 4.3 mmol/L (ref 3.5–5.2)
Sodium: 140 mmol/L (ref 134–144)
Total Protein: 6.3 g/dL (ref 6.0–8.5)
eGFR: 65 mL/min/{1.73_m2} (ref >59)

## 2023-09-06 LAB — LIPID PANEL
Chol/HDL Ratio: 2.7 ratio (ref 0.0–5.0)
Cholesterol, Total: 111 mg/dL (ref 100–199)
HDL: 41 mg/dL (ref >39)
LDL Chol Calc (NIH): 47 mg/dL (ref 0–99)
Triglycerides: 134 mg/dL (ref 0–149)
VLDL Cholesterol Cal: 23 mg/dL (ref 5–40)

## 2023-09-06 LAB — PSA: Prostate Specific Ag, Serum: 0.5 ng/mL (ref 0.0–4.0)

## 2023-09-06 LAB — MICROALBUMIN / CREATININE URINE RATIO
Creatinine, Urine: 151.8 mg/dL
Microalb/Creat Ratio: 2 mg/g{creat} (ref 0–29)
Microalbumin, Urine: 3 ug/mL

## 2023-09-06 MED ORDER — SODIUM CHLORIDE 0.9 % IV SOLN: 500.0000 mL | Freq: Once | INTRAVENOUS | Status: DC

## 2023-09-06 NOTE — Progress Notes (Signed)
 Called to room to assist during endoscopic procedure.  Patient ID and intended procedure confirmed with present staff. Received instructions for my participation in the procedure from the performing physician.

## 2023-09-06 NOTE — Progress Notes (Signed)
 Report to PACU, RN, vss, BBS= Clear.

## 2023-09-06 NOTE — Patient Instructions (Addendum)
 I found and removed two small polyps that look benign.  I will let you know pathology results and when to have another routine colonoscopy by mail and/or My Chart.  Also saw internal hemorrhoids.  I appreciate the opportunity to care for you. Kenney Peacemaker, MD, FACG  YOU HAD AN ENDOSCOPIC PROCEDURE TODAY AT THE [facility]:   Refer to the procedure report that was given to you for any specific questions about what was found during the examination.  If the procedure report does not answer your questions, please call your gastroenterologist to clarify.  If you requested that your care partner not be given the details of your procedure findings, then the procedure report has been included in a sealed envelope for you to review at your convenience later.  YOU SHOULD EXPECT: Some feelings of bloating in the abdomen. Passage of more gas than usual.  Walking can help get rid of the air that was put into your GI tract during the procedure and reduce the bloating. If you had a lower endoscopy (such as a colonoscopy or flexible sigmoidoscopy) you may notice spotting of blood in your stool or on the toilet paper. If you underwent a bowel prep for your procedure, you may not have a normal bowel movement for a few days.  Please Note:  You might notice some irritation and congestion in your nose or some drainage.  This is from the oxygen used during your procedure.  There is no need for concern and it should clear up in a day or so.  SYMPTOMS TO REPORT IMMEDIATELY:  Following lower endoscopy (colonoscopy or flexible sigmoidoscopy):  Excessive amounts of blood in the stool  Significant tenderness or worsening of abdominal pains  Swelling of the abdomen that is new, acute  Fever of 100F or higher  Following upper endoscopy (EGD)  Vomiting of blood or coffee ground material  New chest pain or pain under the shoulder blades  Painful or persistently difficult swallowing  New shortness of  breath  Fever of 100F or higher  Black, tarry-looking stools  For urgent or emergent issues, a gastroenterologist can be reached at any hour by calling [phone]. Do not use MyChart messaging for urgent concerns.    DIET:  We do recommend a small meal at first, but then you may proceed to your regular diet.  Drink plenty of fluids but you should avoid alcoholic beverages for 24 hours.  ACTIVITY:  You should plan to take it easy for the rest of today and you should NOT DRIVE or use heavy machinery until tomorrow (because of the sedation medicines used during the test).    FOLLOW UP: Our staff will call the number listed on your records the next business day following your procedure.  We will call around 7:15- 8:00 am to check on you and address any questions or concerns that you may have regarding the information given to you following your procedure. If we do not reach you, we will leave a message.     If any biopsies were taken you will be contacted by phone or by letter within the next 1-3 weeks.  Please call us  at [phone] if you have not heard about the biopsies in 3 weeks.    SIGNATURES/CONFIDENTIALITY: You and/or your care partner have signed paperwork which will be entered into your electronic medical record.  These signatures attest to the fact that that the information above on your After Visit Summary has been reviewed  and is understood.  Full responsibility of the confidentiality of this discharge information lies with you and/or your care-partner.

## 2023-09-06 NOTE — Progress Notes (Signed)
Pt's states no medical or surgical changes since previsit or office visit.Pt's states no medical or surgical changes since previsit or office visit. 

## 2023-09-06 NOTE — Op Note (Signed)
 [facility] Patient Name: Cristian Davis Procedure Date: 09/06/2023 10:46 AM MRN: [id number] Endoscopist: Kenney Peacemaker , MD, [id number] Age: 68 Referring MD:  Date of Birth: 1956-01-16 Gender: Male Account #: [id number] Procedure:                Colonoscopy Indications:              Surveillance: Personal history of adenomatous                            polyps on last colonoscopy > 5 years ago, Last                            colonoscopy: 2018 Medicines:                Monitored Anesthesia Care Procedure:                Pre-Anesthesia Assessment:                           - Prior to the procedure, a History and Physical                            was performed, and patient medications and                            allergies were reviewed. The patient's tolerance of                            previous anesthesia was also reviewed. The risks                            and benefits of the procedure and the sedation                            options and risks were discussed with the patient.                            All questions were answered, and informed consent                            was obtained. Prior Anticoagulants: The patient has                            taken no anticoagulant or antiplatelet agents. ASA                            Grade Assessment: II - A patient with mild systemic                            disease. After reviewing the risks and benefits,                            the patient was deemed in satisfactory condition to  undergo the procedure.                           After obtaining informed consent, the colonoscope                            was passed under direct vision. Throughout the                            procedure, the patient's blood pressure, pulse, and                            oxygen saturations were monitored continuously. The                            CF [id number] #[id number] was introduced through the  anus                            and advanced to the the cecum, identified by                            appendiceal orifice and ileocecal valve. The                            colonoscopy was performed without difficulty. The                            patient tolerated the procedure well. The quality                            of the bowel preparation was good. The ileocecal                            valve, appendiceal orifice, and rectum were                            photographed. The bowel preparation used was                            Miralax via split dose instruction. Scope In: 11:04:26 AM Scope Out: 11:20:29 AM Scope Withdrawal Time: 0 hours 11 minutes 5 seconds  Total Procedure Duration: 0 hours 16 minutes 3 seconds  Findings:                 The perianal and digital rectal examinations were                            normal.                           Two sessile polyps were found in the sigmoid colon.                            The polyps were 4 to 6 mm in size. These polyps  were removed with a cold snare. Resection and                            retrieval were complete. Verification of patient                            identification for the specimen was done. Estimated                            blood loss was minimal.                           Internal hemorrhoids were found.                           The exam was otherwise without abnormality on                            direct and retroflexion views. Complications:            No immediate complications. Estimated Blood Loss:     Estimated blood loss was minimal. Impression:               - Two 4 to 6 mm polyps in the sigmoid colon,                            removed with a cold snare. Resected and retrieved.                           - Internal hemorrhoids.                           - The examination was otherwise normal on direct                            and retroflexion views.                            - Personal history of colonic polyp - diminutive                            adenoma removed 2018. Recommendation:           - Patient has a contact number available for                            emergencies. The signs and symptoms of potential                            delayed complications were discussed with the                            patient. Return to normal activities tomorrow.                            Written discharge instructions were provided to the  patient.                           - Resume previous diet.                           - Continue present medications.                           - Repeat colonoscopy is recommended for                            surveillance. The colonoscopy date will be                            determined after pathology results from today's                            exam become available for review. Kenney Peacemaker, MD 09/06/2023 11:28:14 AM This report has been signed electronically.

## 2023-09-07 ENCOUNTER — Telehealth: Payer: Self-pay

## 2023-09-07 NOTE — Telephone Encounter (Signed)
 Attempted to reach patient for follow up phone call. No answer, left voicemail to contact Dr. Jadene Maxwell office with any questions or concerns.

## 2023-09-08 ENCOUNTER — Ambulatory Visit: Payer: Self-pay | Admitting: Family Medicine

## 2023-09-08 LAB — SURGICAL PATHOLOGY

## 2023-09-11 ENCOUNTER — Ambulatory Visit: Payer: Self-pay | Admitting: Internal Medicine

## 2023-10-15 ENCOUNTER — Other Ambulatory Visit: Payer: Self-pay | Admitting: Internal Medicine

## 2023-10-23 ENCOUNTER — Ambulatory Visit: Payer: 59 | Admitting: Family Medicine

## 2023-10-30 ENCOUNTER — Ambulatory Visit: Admitting: Family Medicine

## 2023-10-30 VITALS — BP 139/75 | HR 91 | Temp 97.7°F | Ht 69.0 in | Wt 217.0 lb

## 2023-10-30 DIAGNOSIS — I10 Essential (primary) hypertension: Secondary | ICD-10-CM | POA: Diagnosis not present

## 2023-10-30 DIAGNOSIS — R0609 Other forms of dyspnea: Secondary | ICD-10-CM | POA: Diagnosis not present

## 2023-10-30 DIAGNOSIS — K219 Gastro-esophageal reflux disease without esophagitis: Secondary | ICD-10-CM

## 2023-10-30 MED ORDER — PANTOPRAZOLE SODIUM 40 MG PO TBEC: 40.0000 mg | DELAYED_RELEASE_TABLET | Freq: Every day | ORAL | 1 refills | Status: DC

## 2023-10-30 NOTE — Assessment & Plan Note (Signed)
Stable. Protonix refilled.

## 2023-10-30 NOTE — Progress Notes (Signed)
 Subjective:  Patient ID: Cristian Davis, male    DOB: 1955/09/25  Age: 68 y.o. MRN: [id number]  CC:   Chief Complaint  Patient presents with   Hypertension   6 month follow up    HPI:  68 year old male with the below mentioned medical problems presents for follow up.  DOE stable.   HTN stable on Valsartan .  GERD stable on Protonix . Needs refill. Protonix  helps with Upper airway cough syndrome as well.  Denies chest pain.   Feeling well overall.  Patient Active Problem List   Diagnosis Date Noted   ILD (interstitial lung disease) (HCC) 05/16/2023   Coronary artery calcification 01/16/2023   DOE (dyspnea on exertion) 12/15/2022   Chondromalacia of left patella 10/10/2022   Chondromalacia of right patella 10/10/2022   GERD (gastroesophageal reflux disease) 05/30/2022   Thrombocytopenia (HCC) 05/30/2022   Mixed hyperlipidemia 11/26/2021   Essential hypertension 03/26/2021   Tinnitus, bilateral 04/17/2019   Hx of adenomatous polyp of colon 08/03/2016   Insomnia 02/18/2013   Vitiligo 02/18/2013    Social Hx   Social History   Socioeconomic History   Marital status: Married    Spouse name: Not on file   Number of children: Not on file   Years of education: Not on file   Highest education level: Not on file  Occupational History    Employer: [employer]  Tobacco Use   Smoking status: Never   Smokeless tobacco: Never  Vaping Use   Vaping status: Never Used  Substance and Sexual Activity   Alcohol use: Yes    Comment: occasional   Drug use: No   Sexual activity: Yes  Other Topics Concern   Not on file  Social History Narrative   Married   Works at [employer]    Never smoker, occ EtOH, no drugs   Social Drivers of Corporate investment banker Strain: Not on file  Food Insecurity: Not on file  Transportation Needs: Not on file  Physical Activity: Not on file  Stress: Not on file  Social Connections: Not on file    Review of Systems Per  HPI  Objective:  BP 139/75   Pulse 91   Temp 97.7 F (36.5 C)   Ht 5' 9 (1.753 m)   Wt 217 lb (98.4 kg)   SpO2 99%   BMI 32.05 kg/m      10/30/2023    3:41 PM 09/06/2023   11:43 AM 09/06/2023   11:32 AM  BP/Weight  Systolic BP 139 107 111  Diastolic BP 75 57 68  Wt. (Lbs) 217    BMI 32.05 kg/m2      Physical Exam Vitals and nursing note reviewed.  Constitutional:      General: He is not in acute distress.    Appearance: Normal appearance.  HENT:     Head: Normocephalic and atraumatic.  Eyes:     General:        Right eye: No discharge.        Left eye: No discharge.     Conjunctiva/sclera: Conjunctivae normal.  Cardiovascular:     Rate and Rhythm: Normal rate and regular rhythm.  Pulmonary:     Effort: Pulmonary effort is normal.     Breath sounds: Normal breath sounds. No wheezing, rhonchi or rales.  Neurological:     Mental Status: He is alert.  Psychiatric:        Mood and Affect: Mood normal.  Behavior: Behavior normal.     Lab Results  Component Value Date   WBC 4.6 09/05/2023   HGB 15.8 09/05/2023   HCT 44.5 09/05/2023   PLT 133 (L) 09/05/2023   GLUCOSE 112 (H) 09/05/2023   CHOL 111 09/05/2023   TRIG 134 09/05/2023   HDL 41 09/05/2023   LDLCALC 47 09/05/2023   ALT 35 09/05/2023   AST 35 09/05/2023   NA 140 09/05/2023   K 4.3 09/05/2023   CL 100 09/05/2023   CREATININE 1.22 09/05/2023   BUN 17 09/05/2023   CO2 24 09/05/2023   PSA 0.46 02/07/2014   HGBA1C 5.1 09/05/2023     Assessment & Plan:  Essential hypertension Assessment & Plan: Stable. Continue Valsartan .   DOE (dyspnea on exertion) Assessment & Plan: Stable.   Gastroesophageal reflux disease without esophagitis Assessment & Plan: Stable. Protonix  refilled.   Other orders -     Pantoprazole  Sodium; Take 1 tablet (40 mg total) by mouth daily.  Dispense: 90 tablet; Refill: 1    Follow-up:  6 months  Rashed Edler Bluford DO [facility]

## 2023-10-30 NOTE — Patient Instructions (Signed)
Medication refilled  Follow up in 6 months

## 2023-10-30 NOTE — Assessment & Plan Note (Signed)
 Stable. Continue Valsartan.

## 2023-10-30 NOTE — Assessment & Plan Note (Signed)
 Stable

## 2023-11-29 ENCOUNTER — Encounter: Payer: Self-pay | Admitting: Family Medicine

## 2023-11-29 ENCOUNTER — Ambulatory Visit: Admitting: Family Medicine

## 2023-11-29 VITALS — BP 130/83 | HR 85 | Temp 98.1°F | Ht 69.0 in | Wt 216.0 lb

## 2023-11-29 DIAGNOSIS — B356 Tinea cruris: Secondary | ICD-10-CM | POA: Insufficient documentation

## 2023-11-29 MED ORDER — FLUCONAZOLE 150 MG PO TABS: 150.0000 mg | ORAL_TABLET | ORAL | 0 refills | Status: AC

## 2023-11-29 NOTE — Progress Notes (Signed)
 Subjective:  Patient ID: Cristian Davis, male    DOB: [birth year]  Age: 68 y.o. MRN: [id number]  CC:   Chief Complaint  Patient presents with   Rash    Jock itch x's 1 month  Otc medication not working    HPI:  68 year old male presents for evaluation of the above.  Ongoing jock itch. No relief with otc treatment. Reports associated burning. No other associated symptoms. No other complaints.   Patient Active Problem List   Diagnosis Date Noted   Tinea cruris 11/29/2023   ILD (interstitial lung disease) (HCC) 05/16/2023   Coronary artery calcification 01/16/2023   DOE (dyspnea on exertion) 12/15/2022   Chondromalacia of left patella 10/10/2022   Chondromalacia of right patella 10/10/2022   GERD (gastroesophageal reflux disease) 05/30/2022   Thrombocytopenia (HCC) 05/30/2022   Mixed hyperlipidemia 11/26/2021   Essential hypertension 03/26/2021   Tinnitus, bilateral 04/17/2019   Hx of adenomatous polyp of colon 08/03/2016   Insomnia 02/18/2013   Vitiligo 02/18/2013    Social Hx   Social History   Socioeconomic History   Marital status: Married    Spouse name: Not on file   Number of children: Not on file   Years of education: Not on file   Highest education level: Not on file  Occupational History    Employer: [employer]  Tobacco Use   Smoking status: Never   Smokeless tobacco: Never  Vaping Use   Vaping status: Never Used  Substance and Sexual Activity   Alcohol use: Yes    Comment: occasional   Drug use: No   Sexual activity: Yes  Other Topics Concern   Not on file  Social History Narrative   Married   Works at [employer]    Never smoker, occ EtOH, no drugs   Social Drivers of Corporate investment banker Strain: Not on file  Food Insecurity: Not on file  Transportation Needs: Not on file  Physical Activity: Not on file  Stress: Not on file  Social Connections: Not on file    Review of Systems Per HPI  Objective:  BP 130/83    Pulse 85   Temp 98.1 F (36.7 C)   Ht 5' 9 (1.753 m)   Wt 216 lb (98 kg)   SpO2 99%   BMI 31.90 kg/m      11/29/2023   11:09 AM 10/30/2023    3:41 PM 09/06/2023   11:43 AM  BP/Weight  Systolic BP 130 139 107  Diastolic BP 83 75 57  Wt. (Lbs) 216 217   BMI 31.9 kg/m2 32.05 kg/m2     Physical Exam Vitals and nursing note reviewed.  Constitutional:      General: He is not in acute distress.    Appearance: Normal appearance.  HENT:     Head: Normocephalic and atraumatic.  Pulmonary:     Effort: Pulmonary effort is normal. No respiratory distress.  Skin:    Comments: Mild erythema in the inguinal region and scrotum.  Neurological:     Mental Status: He is alert.     Lab Results  Component Value Date   WBC 4.6 09/05/2023   HGB 15.8 09/05/2023   HCT 44.5 09/05/2023   PLT 133 (L) 09/05/2023   GLUCOSE 112 (H) 09/05/2023   CHOL 111 09/05/2023   TRIG 134 09/05/2023   HDL 41 09/05/2023   LDLCALC 47 09/05/2023   ALT 35 09/05/2023   AST 35 09/05/2023   NA 140  09/05/2023   K 4.3 09/05/2023   CL 100 09/05/2023   CREATININE 1.22 09/05/2023   BUN 17 09/05/2023   CO2 24 09/05/2023   PSA 0.46 02/07/2014   HGBA1C 5.1 09/05/2023     Assessment & Plan:  Tinea cruris Assessment & Plan: Treating with Diflucan .  Orders: -     Fluconazole ; Take 1 tablet (150 mg total) by mouth once a week.  Dispense: 4 tablet; Refill: 0    Follow-up:  Return if symptoms worsen or fail to improve.  Jacqulyn Ahle DO [facility]

## 2023-11-29 NOTE — Assessment & Plan Note (Signed)
Treating with Diflucan. 

## 2024-01-18 ENCOUNTER — Encounter: Payer: Self-pay | Admitting: Family Medicine

## 2024-02-28 ENCOUNTER — Telehealth: Payer: Self-pay | Admitting: Internal Medicine

## 2024-02-28 NOTE — Telephone Encounter (Signed)
*  STAT* If patient is at the pharmacy, call can be transferred to refill team.   1. Which medications need to be refilled? (please list name of each medication and dose if known)   rosuvastatin  (CRESTOR ) 10 MG tablet    2. Which pharmacy/location (including street and city if local pharmacy) is medication to be sent to?  [facility] - [place], [place] - [id number]    3. Do they need a 30 day or 90 day supply? 90

## 2024-02-29 MED ORDER — ROSUVASTATIN CALCIUM 10 MG PO TABS: 10.0000 mg | ORAL_TABLET | Freq: Every day | ORAL | 1 refills | Status: AC

## 2024-02-29 NOTE — Telephone Encounter (Signed)
 Refill sent.

## 2024-04-25 ENCOUNTER — Other Ambulatory Visit: Payer: Self-pay | Admitting: Family Medicine

## 2024-05-01 ENCOUNTER — Encounter: Payer: Self-pay | Admitting: Family Medicine

## 2024-05-01 ENCOUNTER — Ambulatory Visit: Admitting: Family Medicine

## 2024-05-01 VITALS — BP 113/69 | HR 94 | Temp 97.5°F | Ht 69.0 in | Wt 219.0 lb

## 2024-05-01 DIAGNOSIS — J849 Interstitial pulmonary disease, unspecified: Secondary | ICD-10-CM

## 2024-05-01 DIAGNOSIS — Z23 Encounter for immunization: Secondary | ICD-10-CM | POA: Diagnosis not present

## 2024-05-01 DIAGNOSIS — K219 Gastro-esophageal reflux disease without esophagitis: Secondary | ICD-10-CM

## 2024-05-01 DIAGNOSIS — I10 Essential (primary) hypertension: Secondary | ICD-10-CM | POA: Diagnosis not present

## 2024-05-01 DIAGNOSIS — E782 Mixed hyperlipidemia: Secondary | ICD-10-CM

## 2024-05-01 DIAGNOSIS — F5101 Primary insomnia: Secondary | ICD-10-CM

## 2024-05-01 MED ORDER — PANTOPRAZOLE SODIUM 40 MG PO TBEC: 40.0000 mg | DELAYED_RELEASE_TABLET | Freq: Every day | ORAL | 3 refills | Status: AC

## 2024-05-01 MED ORDER — VALSARTAN 80 MG PO TABS: 80.0000 mg | ORAL_TABLET | Freq: Every day | ORAL | 3 refills | Status: AC

## 2024-05-01 MED ORDER — AMOXICILLIN-POT CLAVULANATE 875-125 MG PO TABS: 1.0000 | ORAL_TABLET | Freq: Two times a day (BID) | ORAL | 0 refills | Status: DC

## 2024-05-01 MED ORDER — AMITRIPTYLINE HCL 75 MG PO TABS: 75.0000 mg | ORAL_TABLET | Freq: Every day | ORAL | 3 refills | Status: AC

## 2024-05-01 NOTE — Patient Instructions (Signed)
Follow up in 6 months.  Take care  Dr. Wren Gallaga  

## 2024-05-01 NOTE — Assessment & Plan Note (Signed)
Stable. Amitriptyline refilled.

## 2024-05-01 NOTE — Progress Notes (Signed)
 "  Subjective:  Patient ID: Cristian Davis, male    DOB: 11-25-1955  Age: 69 y.o. MRN: [id number]  CC:   Chief Complaint  Patient presents with   6 month follow up    HPI:  69 year old male presents for follow up.  Patient is doing well. No complaints today.  BP is well controlled.   Needs flu shot. Amendable to getting today.  No chest pain. Follows with pulm regarding ILD. Stable.  Needs labs.   Needs med refills.  Patient Active Problem List   Diagnosis Date Noted   ILD (interstitial lung disease) (HCC) 05/16/2023   Coronary artery calcification 01/16/2023   DOE (dyspnea on exertion) 12/15/2022   Chondromalacia of left patella 10/10/2022   Chondromalacia of right patella 10/10/2022   GERD (gastroesophageal reflux disease) 05/30/2022   Thrombocytopenia 05/30/2022   Mixed hyperlipidemia 11/26/2021   Essential hypertension 03/26/2021   Tinnitus, bilateral 04/17/2019   Hx of adenomatous polyp of colon 08/03/2016   Insomnia 02/18/2013   Vitiligo 02/18/2013    Social Hx   Social History   Socioeconomic History   Marital status: Married    Spouse name: Not on file   Number of children: Not on file   Years of education: Not on file   Highest education level: Not on file  Occupational History    Employer: [employer]  Tobacco Use   Smoking status: Never   Smokeless tobacco: Never  Vaping Use   Vaping status: Never Used  Substance and Sexual Activity   Alcohol use: Yes    Comment: occasional   Drug use: No   Sexual activity: Yes  Other Topics Concern   Not on file  Social History Narrative   Married   Works at [employer]    Never smoker, occ EtOH, no drugs   Social Drivers of Health   Tobacco Use: Low Risk (05/01/2024)   Patient History    Smoking Tobacco Use: Never    Smokeless Tobacco Use: Never    Passive Exposure: Not on file  Financial Resource Strain: Not on file  Food Insecurity: Not on file  Transportation Needs: Not on file   Physical Activity: Not on file  Stress: Not on file  Social Connections: Not on file  Depression (PHQ2-9): Low Risk (11/29/2023)   Depression (PHQ2-9)    PHQ-2 Score: 0  Alcohol Screen: Not on file  Housing: Not on file  Utilities: Not on file  Health Literacy: Not on file    Review of Systems Per HPI  Objective:  BP 113/69   Pulse 94   Temp (!) 97.5 F (36.4 C)   Ht 5' 9 (1.753 m)   Wt 219 lb (99.3 kg)   SpO2 99%   BMI 32.34 kg/m      05/01/2024    3:11 PM 11/29/2023   11:09 AM 10/30/2023    3:41 PM  BP/Weight  Systolic BP 113 130 139  Diastolic BP 69 83 75  Wt. (Lbs) 219 216 217  BMI 32.34 kg/m2 31.9 kg/m2 32.05 kg/m2    Physical Exam Vitals and nursing note reviewed.  Constitutional:      General: He is not in acute distress.    Appearance: Normal appearance.  HENT:     Head: Normocephalic and atraumatic.  Eyes:     General:        Right eye: No discharge.        Left eye: No discharge.     Conjunctiva/sclera:  Conjunctivae normal.  Cardiovascular:     Rate and Rhythm: Normal rate and regular rhythm.  Pulmonary:     Effort: Pulmonary effort is normal.     Breath sounds: Normal breath sounds. No wheezing, rhonchi or rales.  Neurological:     Mental Status: He is alert.  Psychiatric:        Mood and Affect: Mood normal.        Behavior: Behavior normal.     Lab Results  Component Value Date   WBC 4.6 09/05/2023   HGB 15.8 09/05/2023   HCT 44.5 09/05/2023   PLT 133 (L) 09/05/2023   GLUCOSE 112 (H) 09/05/2023   CHOL 111 09/05/2023   TRIG 134 09/05/2023   HDL 41 09/05/2023   LDLCALC 47 09/05/2023   ALT 35 09/05/2023   AST 35 09/05/2023   NA 140 09/05/2023   K 4.3 09/05/2023   CL 100 09/05/2023   CREATININE 1.22 09/05/2023   BUN 17 09/05/2023   CO2 24 09/05/2023   PSA 0.46 02/07/2014   HGBA1C 5.1 09/05/2023     Assessment & Plan:  Essential hypertension Assessment & Plan: Stable. Valsartan  refilled.   Orders: -     Valsartan ;  Take 1 tablet (80 mg total) by mouth daily.  Dispense: 90 tablet; Refill: 3  Primary insomnia Assessment & Plan: Stable. Amitriptyline  refilled.   Orders: -     Amitriptyline  HCl; Take 1 tablet (75 mg total) by mouth at bedtime.  Dispense: 90 tablet; Refill: 3  Immunization due -     Flu vaccine HIGH DOSE PF(Fluzone Trivalent)  ILD (interstitial lung disease) (HCC) Assessment & Plan: Stable. Follows with pulm.   Mixed hyperlipidemia Assessment & Plan: Lipid panel ordered to assess.   Gastroesophageal reflux disease without esophagitis -     Pantoprazole  Sodium; Take 1 tablet (40 mg total) by mouth daily.  Dispense: 90 tablet; Refill: 3    Follow-up:  6 months  Keymarion Bearman Bluford DO [facility] "

## 2024-05-01 NOTE — Assessment & Plan Note (Signed)
Lipid panel ordered to assess

## 2024-05-01 NOTE — Assessment & Plan Note (Signed)
 Stable. Follows with pulm.

## 2024-05-01 NOTE — Assessment & Plan Note (Signed)
 Stable. Valsartan  refilled.

## 2024-05-27 ENCOUNTER — Ambulatory Visit: Payer: Self-pay | Admitting: Internal Medicine

## 2024-10-29 ENCOUNTER — Ambulatory Visit: Admitting: Family Medicine
